# Patient Record
Sex: Female | Born: 2000
Health system: Southern US, Community
[De-identification: ages and names within clinical notes are randomized; demographics above are authoritative.]

## PROBLEM LIST (undated history)

## (undated) DIAGNOSIS — Z87820 Personal history of traumatic brain injury: Secondary | ICD-10-CM

## (undated) DIAGNOSIS — Z8781 Personal history of (healed) traumatic fracture: Secondary | ICD-10-CM

## (undated) DIAGNOSIS — S82409A Unspecified fracture of shaft of unspecified fibula, initial encounter for closed fracture: Secondary | ICD-10-CM

## (undated) DIAGNOSIS — Q782 Osteopetrosis: Secondary | ICD-10-CM

## (undated) DIAGNOSIS — S92502A Displaced unspecified fracture of left lesser toe(s), initial encounter for closed fracture: Secondary | ICD-10-CM

## (undated) DIAGNOSIS — S2231XA Fracture of one rib, right side, initial encounter for closed fracture: Secondary | ICD-10-CM

## (undated) DIAGNOSIS — S82209A Unspecified fracture of shaft of unspecified tibia, initial encounter for closed fracture: Secondary | ICD-10-CM

## (undated) HISTORY — DX: Displaced unspecified fracture of left lesser toe(s), initial encounter for closed fracture: S92.502A

## (undated) HISTORY — DX: Personal history of traumatic brain injury: Z87.820

## (undated) HISTORY — DX: Fracture of one rib, right side, initial encounter for closed fracture: S22.31XA

## (undated) HISTORY — DX: Personal history of (healed) traumatic fracture: Z87.81

## (undated) HISTORY — DX: Unspecified fracture of shaft of unspecified fibula, initial encounter for closed fracture: S82.409A

## (undated) HISTORY — DX: Osteopetrosis: Q78.2

## (undated) HISTORY — DX: Unspecified fracture of shaft of unspecified tibia, initial encounter for closed fracture: S82.209A

---
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
## 2020-01-06 DIAGNOSIS — J342 Deviated nasal septum: Secondary | ICD-10-CM | POA: Diagnosis not present

## 2020-01-06 DIAGNOSIS — J343 Hypertrophy of nasal turbinates: Secondary | ICD-10-CM | POA: Diagnosis not present

## 2020-01-06 DIAGNOSIS — J310 Chronic rhinitis: Secondary | ICD-10-CM | POA: Diagnosis not present

## 2020-01-06 DIAGNOSIS — R490 Dysphonia: Secondary | ICD-10-CM | POA: Diagnosis not present

## 2020-01-06 DIAGNOSIS — K219 Gastro-esophageal reflux disease without esophagitis: Secondary | ICD-10-CM | POA: Diagnosis not present

## 2020-01-12 ENCOUNTER — Other Ambulatory Visit: Payer: Self-pay | Admitting: Family Medicine

## 2020-01-12 DIAGNOSIS — R41840 Attention and concentration deficit: Secondary | ICD-10-CM

## 2020-01-12 MED ORDER — LISDEXAMFETAMINE DIMESYLATE 20 MG PO CAPS: 20.0000 mg | ORAL_CAPSULE | Freq: Every day | ORAL | 0 refills | Status: DC

## 2020-01-12 MED FILL — VYVANSE 20 MG CAPSULE: 20 | 30 days supply | Qty: 30 | Fill #0

## 2020-01-23 ENCOUNTER — Telehealth: Payer: Self-pay | Admitting: Family

## 2020-01-23 MED ORDER — SUMATRIPTAN SUCCINATE 50 MG PO TABS: ORAL_TABLET | ORAL | 0 refills | Status: DC

## 2020-01-23 MED ORDER — PROMETHAZINE HCL 25 MG PO TABS
25.0000 mg | ORAL_TABLET | Freq: Three times a day (TID) | ORAL | 0 refills | Status: DC | PRN
Start: 2020-01-23 — End: 2020-01-23

## 2020-01-23 MED ORDER — PROMETHAZINE HCL 25 MG PO TABS: 25.0000 mg | ORAL_TABLET | Freq: Three times a day (TID) | ORAL | 0 refills | Status: DC | PRN

## 2020-01-23 NOTE — Addendum Note (Signed)
Addended by: Debbrah Alar on: 01/23/2020 07:19 PM   Modules accepted: Orders

## 2020-01-23 NOTE — Telephone Encounter (Signed)
Spoke with mom, pt is at college and has a bad migraine- no improvement with excedrin migraine. Pt has had a good response in the past to promethazine.  Rx sent for promethazine as well as imitrex.

## 2020-01-31 DIAGNOSIS — R490 Dysphonia: Secondary | ICD-10-CM | POA: Diagnosis not present

## 2020-01-31 DIAGNOSIS — K219 Gastro-esophageal reflux disease without esophagitis: Secondary | ICD-10-CM | POA: Diagnosis not present

## 2020-02-21 DIAGNOSIS — R490 Dysphonia: Secondary | ICD-10-CM | POA: Diagnosis not present

## 2020-02-21 DIAGNOSIS — K219 Gastro-esophageal reflux disease without esophagitis: Secondary | ICD-10-CM | POA: Diagnosis not present

## 2020-03-06 DIAGNOSIS — Z20822 Contact with and (suspected) exposure to covid-19: Secondary | ICD-10-CM | POA: Diagnosis not present

## 2020-03-13 DIAGNOSIS — K219 Gastro-esophageal reflux disease without esophagitis: Secondary | ICD-10-CM | POA: Diagnosis not present

## 2020-03-13 DIAGNOSIS — R490 Dysphonia: Secondary | ICD-10-CM | POA: Diagnosis not present

## 2020-03-19 ENCOUNTER — Other Ambulatory Visit: Payer: Self-pay

## 2020-03-19 ENCOUNTER — Other Ambulatory Visit: Payer: Self-pay | Admitting: Family Medicine

## 2020-03-19 DIAGNOSIS — R41840 Attention and concentration deficit: Secondary | ICD-10-CM

## 2020-03-19 MED ORDER — LISDEXAMFETAMINE DIMESYLATE 20 MG PO CAPS: 20.0000 mg | ORAL_CAPSULE | Freq: Every day | ORAL | 0 refills | Status: DC

## 2020-03-19 MED FILL — VYVANSE 20 MG CAPSULE: 20 | 30 days supply | Qty: 30 | Fill #0

## 2020-03-19 NOTE — Telephone Encounter (Signed)
Patient mother sent message requesting refill on medication while patient is at home on break.

## 2020-03-20 DIAGNOSIS — K219 Gastro-esophageal reflux disease without esophagitis: Secondary | ICD-10-CM | POA: Diagnosis not present

## 2020-03-20 DIAGNOSIS — R490 Dysphonia: Secondary | ICD-10-CM | POA: Diagnosis not present

## 2020-04-19 DIAGNOSIS — R490 Dysphonia: Secondary | ICD-10-CM | POA: Diagnosis not present

## 2020-04-19 DIAGNOSIS — K219 Gastro-esophageal reflux disease without esophagitis: Secondary | ICD-10-CM | POA: Diagnosis not present

## 2020-04-24 DIAGNOSIS — Z20822 Contact with and (suspected) exposure to covid-19: Secondary | ICD-10-CM | POA: Diagnosis not present

## 2020-05-03 DIAGNOSIS — K219 Gastro-esophageal reflux disease without esophagitis: Secondary | ICD-10-CM | POA: Diagnosis not present

## 2020-05-03 DIAGNOSIS — R490 Dysphonia: Secondary | ICD-10-CM | POA: Diagnosis not present

## 2020-05-07 ENCOUNTER — Other Ambulatory Visit: Payer: Self-pay

## 2020-05-07 ENCOUNTER — Other Ambulatory Visit: Payer: Self-pay | Admitting: Internal Medicine

## 2020-05-07 DIAGNOSIS — R41840 Attention and concentration deficit: Secondary | ICD-10-CM

## 2020-05-07 MED ORDER — SUMATRIPTAN SUCCINATE 50 MG PO TABS: ORAL_TABLET | ORAL | 0 refills | Status: DC

## 2020-05-07 MED ORDER — LISDEXAMFETAMINE DIMESYLATE 20 MG PO CAPS: 20.0000 mg | ORAL_CAPSULE | Freq: Every day | ORAL | 0 refills | Status: DC

## 2020-05-07 MED FILL — VYVANSE 20 MG CAPSULE: 20 | 30 days supply | Qty: 30 | Fill #0

## 2020-05-07 MED FILL — SUMATRIPTAN SUCC 50 MG TAB: 50 | 30 days supply | Qty: 10 | Fill #0

## 2020-05-10 DIAGNOSIS — R490 Dysphonia: Secondary | ICD-10-CM | POA: Diagnosis not present

## 2020-05-10 DIAGNOSIS — K219 Gastro-esophageal reflux disease without esophagitis: Secondary | ICD-10-CM | POA: Diagnosis not present

## 2020-05-17 DIAGNOSIS — R490 Dysphonia: Secondary | ICD-10-CM | POA: Diagnosis not present

## 2020-05-17 DIAGNOSIS — K219 Gastro-esophageal reflux disease without esophagitis: Secondary | ICD-10-CM | POA: Diagnosis not present

## 2020-05-21 ENCOUNTER — Encounter: Payer: Self-pay | Admitting: Family Medicine

## 2020-05-21 DIAGNOSIS — M62838 Other muscle spasm: Secondary | ICD-10-CM

## 2020-05-24 DIAGNOSIS — K219 Gastro-esophageal reflux disease without esophagitis: Secondary | ICD-10-CM | POA: Diagnosis not present

## 2020-05-24 DIAGNOSIS — J310 Chronic rhinitis: Secondary | ICD-10-CM | POA: Diagnosis not present

## 2020-05-24 DIAGNOSIS — R490 Dysphonia: Secondary | ICD-10-CM | POA: Diagnosis not present

## 2020-05-31 DIAGNOSIS — K219 Gastro-esophageal reflux disease without esophagitis: Secondary | ICD-10-CM | POA: Diagnosis not present

## 2020-05-31 DIAGNOSIS — R490 Dysphonia: Secondary | ICD-10-CM | POA: Diagnosis not present

## 2020-06-07 DIAGNOSIS — R490 Dysphonia: Secondary | ICD-10-CM | POA: Diagnosis not present

## 2020-06-07 DIAGNOSIS — R293 Abnormal posture: Secondary | ICD-10-CM | POA: Diagnosis not present

## 2020-06-07 DIAGNOSIS — M542 Cervicalgia: Secondary | ICD-10-CM | POA: Diagnosis not present

## 2020-06-07 DIAGNOSIS — K219 Gastro-esophageal reflux disease without esophagitis: Secondary | ICD-10-CM | POA: Diagnosis not present

## 2020-06-12 DIAGNOSIS — K219 Gastro-esophageal reflux disease without esophagitis: Secondary | ICD-10-CM | POA: Diagnosis not present

## 2020-06-12 DIAGNOSIS — R490 Dysphonia: Secondary | ICD-10-CM | POA: Diagnosis not present

## 2020-06-18 DIAGNOSIS — M542 Cervicalgia: Secondary | ICD-10-CM | POA: Diagnosis not present

## 2020-06-21 DIAGNOSIS — K219 Gastro-esophageal reflux disease without esophagitis: Secondary | ICD-10-CM | POA: Diagnosis not present

## 2020-06-21 DIAGNOSIS — R490 Dysphonia: Secondary | ICD-10-CM | POA: Diagnosis not present

## 2020-06-26 DIAGNOSIS — M542 Cervicalgia: Secondary | ICD-10-CM | POA: Diagnosis not present

## 2020-06-28 ENCOUNTER — Other Ambulatory Visit: Payer: Self-pay

## 2020-06-28 DIAGNOSIS — R41840 Attention and concentration deficit: Secondary | ICD-10-CM

## 2020-06-29 ENCOUNTER — Other Ambulatory Visit: Payer: Self-pay | Admitting: Family Medicine

## 2020-06-29 MED ORDER — LISDEXAMFETAMINE DIMESYLATE 20 MG PO CAPS: 20.0000 mg | ORAL_CAPSULE | Freq: Every day | ORAL | 0 refills | Status: DC

## 2020-07-05 DIAGNOSIS — R490 Dysphonia: Secondary | ICD-10-CM | POA: Diagnosis not present

## 2020-07-05 DIAGNOSIS — K219 Gastro-esophageal reflux disease without esophagitis: Secondary | ICD-10-CM | POA: Diagnosis not present

## 2020-07-16 ENCOUNTER — Other Ambulatory Visit: Payer: Self-pay | Admitting: Family Medicine

## 2020-07-16 DIAGNOSIS — L700 Acne vulgaris: Secondary | ICD-10-CM

## 2020-07-16 DIAGNOSIS — J383 Other diseases of vocal cords: Secondary | ICD-10-CM

## 2020-07-17 DIAGNOSIS — M542 Cervicalgia: Secondary | ICD-10-CM | POA: Diagnosis not present

## 2020-07-24 DIAGNOSIS — M542 Cervicalgia: Secondary | ICD-10-CM | POA: Diagnosis not present

## 2020-08-02 DIAGNOSIS — R490 Dysphonia: Secondary | ICD-10-CM | POA: Diagnosis not present

## 2020-08-02 DIAGNOSIS — K219 Gastro-esophageal reflux disease without esophagitis: Secondary | ICD-10-CM | POA: Diagnosis not present

## 2020-08-03 ENCOUNTER — Other Ambulatory Visit (HOSPITAL_BASED_OUTPATIENT_CLINIC_OR_DEPARTMENT_OTHER): Payer: Self-pay

## 2020-08-03 ENCOUNTER — Other Ambulatory Visit: Payer: Self-pay

## 2020-08-03 DIAGNOSIS — R41840 Attention and concentration deficit: Secondary | ICD-10-CM

## 2020-08-03 MED ORDER — LISDEXAMFETAMINE DIMESYLATE 20 MG PO CAPS
ORAL_CAPSULE | Freq: Every day | ORAL | 0 refills | Status: DC
  Filled 2020-08-03: qty 30, 30d supply, fill #0

## 2020-08-21 NOTE — Progress Notes (Addendum)
[facility] [address] [place], [place] [address] [phone]  Date:  08/27/2020   Name:  Christina Goodwin   DOB:  2001-03-14   MRN:  [id number]  PCP:  Darreld Mclean, MD    Chief Complaint: Other (ADD follow up/)   History of Present Illness:  Christina Goodwin is a 20 y.o. very pleasant female patient who presents with the following:  Here today for a follow-up visit Last seen by myself about a year ago History of migraine headache, ADD and ostoepetrosis  She is using vyvanse for her ADD- she feels like this controls her sx, but it does make her feel a bit anxious.  She takes during the school week but not on the weekends We discussed possibly changing to a different medication for ADD, however she notes that she has already tried a few medications, and Vyvanse that seems the best so far Her migraines are better, but she does get more just run of the mill headaches recently.  Perhaps due to allergies She just started back on retin-a for acne  She is a rising junior at 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  She does try to exercise- she enjoys riding her mom's peloton when they are home  She is not yet SA No excessive alcohol, no smoking or drug use   08/03/2020  08/03/2020   2  Vyvanse 20 Mg Capsule  30.00  30  Je Cop  [id number]  Med (5269)  0/0   Other  [place]    06/29/2020  06/29/2020   1  Vyvanse 20 Mg Capsule  30.00  30  Je Cop  [id number]  Med (5269)  0/0   Comm Ins  [place]    05/07/2020  05/07/2020   1  Vyvanse 20 Mg Capsule  30.00  30  Donald Siva  [id number]  Med [phone])  0/0   Comm Ins  [place]    03/19/2020  03/19/2020   1  Vyvanse 20 Mg Capsule  30.00  30  Je Cop  [id number]  Med (5269)  0/0   Comm Ins  [place]    01/12/2020  01/12/2020   1  Vyvanse 20 Mg Capsule              covid booster done   Wt Readings from Last 3 Encounters:   08/27/20 154 lb (69.9 kg) (83 %, Z= 0.97)*  11/14/19 161 lb (73 kg) (89 %, Z= 1.22)*  11/03/19 155 lb (70.3 kg) (86 %, Z= 1.07)*   * Growth percentiles are based on CDC (Girls, 2-20 Years) data.     Patient Active Problem List   Diagnosis Date Noted  . Finger laceration 11/14/2019  . Migraine with aura and with status migrainosus, not intractable 09/08/2019  . Attention deficit 08/12/2017  . 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 Well child check 10/08/2011    Past Medical History:  Diagnosis Date  . Fracture of fourth toe, left, closed 12/17/2011  . Fracture tibia/fibula 2010   Left (s/p a fall--not sports related)  . H/O finger fracture   . History of concussion approx age 61   Fell off couch due to laughing too hard; had n/v and HA x 24h---no return of sx's since that time.  . Osteopetrosis   . Right rib fracture    2 ribs fractured  in 2 spots with small pneumothorax--f/u cxr good.    Past Surgical History:  Procedure Laterality Date  . FRACTURE SURGERY  2010   tib/fib repair and pin removal (left); 2 surgeries    Social History   Tobacco Use  . Smoking status: Never Smoker  . Smokeless tobacco: Never Used  Vaping Use  . Vaping Use: Never used  Substance Use Topics  . Alcohol use: No  . Drug use: No    Family History  Problem Relation Age of Onset  . Migraines Mother        with puberty  . Headache Father   . Migraines Sister   . Migraines Maternal Grandmother        worse perimenopausal  . Migraines Paternal Grandmother   . Migraines Sister        not as bad as the pt & sister [name]  . Other Paternal Grandfather        osteopetrosis  . Migraines Other     Allergies  Allergen Reactions  . Hydrocodone Hives    "Red Spots"    Medication list has been reviewed and updated.  Current Outpatient Medications on File Prior to Visit  Medication Sig Dispense Refill  . etonogestrel (NEXPLANON) 68 MG IMPL implant 1 each by Subdermal route once.    .  lisdexamfetamine (VYVANSE) 20 MG capsule TAKE 1 CAPSULE BY MOUTH ONCE DAILY 30 capsule 0  . promethazine (PHENERGAN) 25 MG tablet Take 1 tablet (25 mg total) by mouth every 8 (eight) hours as needed for nausea or vomiting. 20 tablet 0  . SUMAtriptan (IMITREX) 50 MG tablet TAKE 1 TABLET BY MOUTH AT START OF MIGRAINE MAY REPEAT IN 2 HOURS IF HEADACHE PERSISTS OR RECURS *MAX 2 TABS IN 24 HOURS* 10 tablet 0  . tretinoin (RETIN-A) 0.05 % cream Apply topically at bedtime. Use every 2-3 days or less if skin becomes irritated 45 g 3   No current facility-administered medications on file prior to visit.    Review of Systems:  As per HPI- otherwise negative.   Physical Examination: Vitals:   08/27/20 1109  BP: 98/64  Pulse: 77  Resp: 18  SpO2: 98%   Vitals:   08/27/20 1109  Weight: 154 lb (69.9 kg)  Height: 5\' 6"  (1.676 m)   Body mass index is 24.86 kg/m. Ideal Body Weight: Weight in (lb) to have BMI = 25: 154.6  GEN: no acute distress.  Normal weight, looks well HEENT: Atraumatic, Normocephalic. Bilateral TM wnl, oropharynx normal.  PEERL,EOMI.   Mild acne mostly along chin Ears and Nose: No external deformity. CV: RRR, No M/G/R. No JVD. No thrill. No extra heart sounds. PULM: CTA B, no wheezes, crackles, rhonchi. No retractions. No resp. distress. No accessory muscle use. ABD: S, NT, ND, +BS. No rebound. No HSM. EXTR: No c/c/e PSYCH: Normally interactive. Conversant.    Assessment and Plan: Physical exam  Attention deficit - Plan: lisdexamfetamine (VYVANSE) 20 MG capsule  Alkaline phosphatase elevation - Plan: Gamma GT, Hepatic function panel  Migraine with aura and with status migrainosus, not intractable  Generally healthy young woman here today for physical exam Encouraged healthy diet and exercise routine Mildly elevated alkaline phosphatase last year, will recheck this for her today Suggested that she try over-the-counter nonsedating antihistamine or nasal steroid  spray for possible allergies Migraines have been stable She will let us know if any concerns or other needs This visit occurred during the SARS-CoV-2 public health emergency.  Safety protocols  were in place, including screening questions prior to the visit, additional usage of staff PPE, and extensive cleaning of exam room while observing appropriate contact time as indicated for disinfecting solutions.    Signed Lamar Blinks, MD  Received her labs as below, message to pt  Results for orders placed or performed in visit on 08/27/20  Gamma GT  Result Value Ref Range   GGT 25 7 - 51 U/L  Hepatic function panel  Result Value Ref Range   Total Bilirubin 0.7 0.2 - 1.2 mg/dL   Bilirubin, Direct 0.1 0.0 - 0.3 mg/dL   Alkaline Phosphatase 91 47 - 119 U/L   AST 41 (H) 0 - 37 U/L   ALT 22 0 - 35 U/L   Total Protein 7.4 6.0 - 8.3 g/dL   Albumin 4.7 3.5 - 5.2 g/dL

## 2020-08-27 ENCOUNTER — Other Ambulatory Visit: Payer: Self-pay

## 2020-08-27 ENCOUNTER — Encounter: Payer: Self-pay | Admitting: Family Medicine

## 2020-08-27 ENCOUNTER — Other Ambulatory Visit (HOSPITAL_BASED_OUTPATIENT_CLINIC_OR_DEPARTMENT_OTHER): Payer: Self-pay

## 2020-08-27 ENCOUNTER — Ambulatory Visit (INDEPENDENT_AMBULATORY_CARE_PROVIDER_SITE_OTHER): Payer: 59 | Admitting: Family Medicine

## 2020-08-27 VITALS — BP 98/64 | HR 77 | Resp 18 | Ht 66.0 in | Wt 154.0 lb

## 2020-08-27 DIAGNOSIS — G43101 Migraine with aura, not intractable, with status migrainosus: Secondary | ICD-10-CM | POA: Diagnosis not present

## 2020-08-27 DIAGNOSIS — R748 Abnormal levels of other serum enzymes: Secondary | ICD-10-CM

## 2020-08-27 DIAGNOSIS — R41840 Attention and concentration deficit: Secondary | ICD-10-CM

## 2020-08-27 DIAGNOSIS — Z0000 Encounter for general adult medical examination without abnormal findings: Secondary | ICD-10-CM | POA: Diagnosis not present

## 2020-08-27 LAB — HEPATIC FUNCTION PANEL
ALT: 22 U/L (ref 0–35)
AST: 41 U/L — ABNORMAL HIGH (ref 0–37)
Albumin: 4.7 g/dL (ref 3.5–5.2)
Alkaline Phosphatase: 91 U/L (ref 47–119)
Bilirubin, Direct: 0.1 mg/dL (ref 0.0–0.3)
Total Bilirubin: 0.7 mg/dL (ref 0.2–1.2)
Total Protein: 7.4 g/dL (ref 6.0–8.3)

## 2020-08-27 LAB — GAMMA GT: GGT: 25 U/L (ref 7–51)

## 2020-08-27 MED ORDER — LISDEXAMFETAMINE DIMESYLATE 20 MG PO CAPS
ORAL_CAPSULE | Freq: Every day | ORAL | 0 refills | Status: DC
  Filled 2020-08-27: qty 30, fill #0
  Filled 2020-08-31: qty 30, 30d supply, fill #0

## 2020-08-27 NOTE — Patient Instructions (Signed)
Great to see you again as always!  Take care and let me know if you have any concerns I wonder if your more frequent mild headaches may be related to allergies- perhaps try claritin/ zyrtec OTC and/ or a nasal steroid like flonase  Let me know if you need anything or have any concerns!     Health Maintenance, Female Adopting a healthy lifestyle and getting preventive care are important in promoting health and wellness. Ask your health care provider about:  The right schedule for you to have regular tests and exams.  Things you can do on your own to prevent diseases and keep yourself healthy. What should I know about diet, weight, and exercise? Eat a healthy diet  Eat a diet that includes plenty of vegetables, fruits, low-fat dairy products, and lean protein.  Do not eat a lot of foods that are high in solid fats, added sugars, or sodium.   Maintain a healthy weight Body mass index (BMI) is used to identify weight problems. It estimates body fat based on height and weight. Your health care provider can help determine your BMI and help you achieve or maintain a healthy weight. Get regular exercise Get regular exercise. This is one of the most important things you can do for your health. Most adults should:  Exercise for at least 150 minutes each week. The exercise should increase your heart rate and make you sweat (moderate-intensity exercise).  Do strengthening exercises at least twice a week. This is in addition to the moderate-intensity exercise.  Spend less time sitting. Even light physical activity can be beneficial. Watch cholesterol and blood lipids Have your blood tested for lipids and cholesterol at 20 years of age, then have this test every 5 years. Have your cholesterol levels checked more often if:  Your lipid or cholesterol levels are high.  You are older than 20 years of age.  You are at high risk for heart disease. What should I know about cancer  screening? Depending on your health history and family history, you may need to have cancer screening at various ages. This may include screening for:  Breast cancer.  Cervical cancer.  Colorectal cancer.  Skin cancer.  Lung cancer. What should I know about heart disease, diabetes, and high blood pressure? Blood pressure and heart disease  High blood pressure causes heart disease and increases the risk of stroke. This is more likely to develop in people who have high blood pressure readings, are of African descent, or are overweight.  Have your blood pressure checked: ? Every 3-5 years if you are 83-36 years of age. ? Every year if you are 34 years old or older. Diabetes Have regular diabetes screenings. This checks your fasting blood sugar level. Have the screening done:  Once every three years after age 74 if you are at a normal weight and have a low risk for diabetes.  More often and at a younger age if you are overweight or have a high risk for diabetes. What should I know about preventing infection? Hepatitis B If you have a higher risk for hepatitis B, you should be screened for this virus. Talk with your health care provider to find out if you are at risk for hepatitis B infection. Hepatitis C Testing is recommended for:  Everyone born from [birth year] through 1965.  Anyone with known risk factors for hepatitis C. Sexually transmitted infections (STIs)  Get screened for STIs, including gonorrhea and chlamydia, if: ? You are sexually active  and are younger than 20 years of age. ? You are older than 20 years of age and your health care provider tells you that you are at risk for this type of infection. ? Your sexual activity has changed since you were last screened, and you are at increased risk for chlamydia or gonorrhea. Ask your health care provider if you are at risk.  Ask your health care provider about whether you are at high risk for HIV. Your health care provider may  recommend a prescription medicine to help prevent HIV infection. If you choose to take medicine to prevent HIV, you should first get tested for HIV. You should then be tested every 3 months for as long as you are taking the medicine. Pregnancy  If you are about to stop having your period (premenopausal) and you may become pregnant, seek counseling before you get pregnant.  Take 400 to 800 micrograms (mcg) of folic acid every day if you become pregnant.  Ask for birth control (contraception) if you want to prevent pregnancy. Osteoporosis and menopause Osteoporosis is a disease in which the bones lose minerals and strength with aging. This can result in bone fractures. If you are 82 years old or older, or if you are at risk for osteoporosis and fractures, ask your health care provider if you should:  Be screened for bone loss.  Take a calcium or vitamin D supplement to lower your risk of fractures.  Be given hormone replacement therapy (HRT) to treat symptoms of menopause. Follow these instructions at home: Lifestyle  Do not use any products that contain nicotine or tobacco, such as cigarettes, e-cigarettes, and chewing tobacco. If you need help quitting, ask your health care provider.  Do not use street drugs.  Do not share needles.  Ask your health care provider for help if you need support or information about quitting drugs. Alcohol use  Do not drink alcohol if: ? Your health care provider tells you not to drink. ? You are pregnant, may be pregnant, or are planning to become pregnant.  If you drink alcohol: ? Limit how much you use to 0-1 drink a day. ? Limit intake if you are breastfeeding.  Be aware of how much alcohol is in your drink. In the [place] one drink equals one 12 oz bottle of beer (355 mL), one 5 oz glass of wine (148 mL), or one 1 oz glass of hard liquor (44 mL). General instructions  Schedule regular health, dental, and eye exams.  Stay current with your  vaccines.  Tell your health care provider if: ? You often feel depressed. ? You have ever been abused or do not feel safe at home. Summary  Adopting a healthy lifestyle and getting preventive care are important in promoting health and wellness.  Follow your health care provider's instructions about healthy diet, exercising, and getting tested or screened for diseases.  Follow your health care provider's instructions on monitoring your cholesterol and blood pressure. This information is not intended to replace advice given to you by your health care provider. Make sure you discuss any questions you have with your health care provider. Document Revised: 03/24/2018 Document Reviewed: 03/24/2018 Elsevier Patient Education  2021 [employer].

## 2020-08-31 ENCOUNTER — Other Ambulatory Visit (HOSPITAL_BASED_OUTPATIENT_CLINIC_OR_DEPARTMENT_OTHER): Payer: Self-pay

## 2020-09-11 ENCOUNTER — Other Ambulatory Visit (HOSPITAL_BASED_OUTPATIENT_CLINIC_OR_DEPARTMENT_OTHER): Payer: Self-pay

## 2020-11-20 ENCOUNTER — Other Ambulatory Visit: Payer: Self-pay

## 2020-11-20 ENCOUNTER — Other Ambulatory Visit (HOSPITAL_BASED_OUTPATIENT_CLINIC_OR_DEPARTMENT_OTHER): Payer: Self-pay

## 2020-11-20 DIAGNOSIS — R41840 Attention and concentration deficit: Secondary | ICD-10-CM

## 2020-11-20 MED ORDER — LISDEXAMFETAMINE DIMESYLATE 20 MG PO CAPS
ORAL_CAPSULE | Freq: Every day | ORAL | 0 refills | Status: DC
  Filled 2020-11-20: qty 30, 30d supply, fill #0

## 2020-12-11 ENCOUNTER — Other Ambulatory Visit (HOSPITAL_BASED_OUTPATIENT_CLINIC_OR_DEPARTMENT_OTHER): Payer: Self-pay

## 2020-12-11 MED ORDER — CARESTART COVID-19 HOME TEST VI KIT
PACK | 0 refills | Status: DC
  Filled 2020-12-11: qty 2, 4d supply, fill #0

## 2021-01-07 ENCOUNTER — Encounter: Payer: Self-pay | Admitting: Family Medicine

## 2021-01-07 DIAGNOSIS — R499 Unspecified voice and resonance disorder: Secondary | ICD-10-CM

## 2021-01-08 NOTE — Addendum Note (Signed)
Addended by: Lamar Blinks C on: 01/08/2021 07:02 AM   Modules accepted: Orders

## 2021-01-13 DIAGNOSIS — R0981 Nasal congestion: Secondary | ICD-10-CM | POA: Diagnosis not present

## 2021-01-13 DIAGNOSIS — J069 Acute upper respiratory infection, unspecified: Secondary | ICD-10-CM | POA: Diagnosis not present

## 2021-01-13 DIAGNOSIS — R051 Acute cough: Secondary | ICD-10-CM | POA: Diagnosis not present

## 2021-01-13 DIAGNOSIS — R5383 Other fatigue: Secondary | ICD-10-CM | POA: Diagnosis not present

## 2021-01-13 DIAGNOSIS — Z20822 Contact with and (suspected) exposure to covid-19: Secondary | ICD-10-CM | POA: Diagnosis not present

## 2021-01-13 DIAGNOSIS — J029 Acute pharyngitis, unspecified: Secondary | ICD-10-CM | POA: Diagnosis not present

## 2021-01-13 DIAGNOSIS — R12 Heartburn: Secondary | ICD-10-CM | POA: Diagnosis not present

## 2021-01-16 DIAGNOSIS — L700 Acne vulgaris: Secondary | ICD-10-CM | POA: Diagnosis not present

## 2021-01-16 DIAGNOSIS — L738 Other specified follicular disorders: Secondary | ICD-10-CM | POA: Diagnosis not present

## 2021-01-31 DIAGNOSIS — K219 Gastro-esophageal reflux disease without esophagitis: Secondary | ICD-10-CM | POA: Diagnosis not present

## 2021-02-14 ENCOUNTER — Other Ambulatory Visit: Payer: Self-pay | Admitting: Family Medicine

## 2021-02-14 DIAGNOSIS — R41840 Attention and concentration deficit: Secondary | ICD-10-CM

## 2021-02-15 ENCOUNTER — Other Ambulatory Visit (HOSPITAL_BASED_OUTPATIENT_CLINIC_OR_DEPARTMENT_OTHER): Payer: Self-pay

## 2021-02-15 MED ORDER — LISDEXAMFETAMINE DIMESYLATE 20 MG PO CAPS
ORAL_CAPSULE | Freq: Every day | ORAL | 0 refills | Status: DC
  Filled 2021-02-15 – 2021-02-28 (×2): qty 30, 30d supply, fill #0

## 2021-02-27 ENCOUNTER — Other Ambulatory Visit (HOSPITAL_BASED_OUTPATIENT_CLINIC_OR_DEPARTMENT_OTHER): Payer: Self-pay

## 2021-02-28 ENCOUNTER — Other Ambulatory Visit (HOSPITAL_BASED_OUTPATIENT_CLINIC_OR_DEPARTMENT_OTHER): Payer: Self-pay

## 2021-03-19 ENCOUNTER — Other Ambulatory Visit: Payer: Self-pay | Admitting: Family Medicine

## 2021-03-19 MED ORDER — CIPROFLOXACIN HCL 500 MG PO TABS: ORAL_TABLET | ORAL | 0 refills | Status: DC

## 2021-03-19 MED ORDER — ONDANSETRON HCL 8 MG PO TABS: 8.0000 mg | ORAL_TABLET | Freq: Three times a day (TID) | ORAL | 0 refills | Status: DC | PRN

## 2021-04-02 ENCOUNTER — Ambulatory Visit: Payer: 59 | Admitting: Internal Medicine

## 2021-04-04 ENCOUNTER — Other Ambulatory Visit (HOSPITAL_BASED_OUTPATIENT_CLINIC_OR_DEPARTMENT_OTHER): Payer: Self-pay

## 2021-04-04 MED ORDER — CARESTART COVID-19 HOME TEST VI KIT
PACK | 0 refills | Status: DC
  Filled 2021-04-04: qty 4, 4d supply, fill #0

## 2021-04-16 ENCOUNTER — Ambulatory Visit: Payer: 59 | Admitting: Family

## 2021-04-16 ENCOUNTER — Other Ambulatory Visit (HOSPITAL_BASED_OUTPATIENT_CLINIC_OR_DEPARTMENT_OTHER): Payer: Self-pay

## 2021-04-16 VITALS — BP 110/57 | HR 77 | Temp 98.5°F | Resp 16 | Wt 167.0 lb

## 2021-04-16 DIAGNOSIS — K148 Other diseases of tongue: Secondary | ICD-10-CM | POA: Diagnosis not present

## 2021-04-16 DIAGNOSIS — L700 Acne vulgaris: Secondary | ICD-10-CM | POA: Diagnosis not present

## 2021-04-16 MED ORDER — VALACYCLOVIR HCL 1 G PO TABS
1000.0000 mg | ORAL_TABLET | Freq: Two times a day (BID) | ORAL | 0 refills | Status: AC
  Filled 2021-04-16: qty 14, 7d supply, fill #0

## 2021-04-16 MED ORDER — NYSTATIN 100000 UNIT/ML MT SUSP
10.0000 mL | Freq: Three times a day (TID) | OROMUCOSAL | 0 refills | Status: AC
  Filled 2021-04-16: qty 210, 7d supply, fill #0

## 2021-04-16 NOTE — Patient Instructions (Signed)
Please begin valtrex and magic mouth wash. You should be contacted about your referral to [facility] in [place]-  [phone].

## 2021-04-16 NOTE — Progress Notes (Signed)
Subjective:     Patient ID: Christina Goodwin, female    DOB: April 28, 2000, 21 y.o.   MRN: [id number]  Chief Complaint  Patient presents with   Oral Pain    Complains of sore spot on tongue for 2 months    Oral Pain   Patient is in today to discuss a tongue sore which has been present for 2 months. Reports that it started small and has "gotten worse."  Reports pain with eating/swallowing. Saw an ENT at [facility] "back in the beginning" and she was told "everything is OK."    Health Maintenance Due  Topic Date Due   HIV Screening  Never done   Hepatitis C Screening  Never done   COVID-19 Vaccine (3 - Booster for [place] series) 09/24/2019   INFLUENZA VACCINE  11/12/2020    Past Medical History:  Diagnosis Date   Fracture of fourth toe, left, closed 12/17/2011   Fracture tibia/fibula 2010   Left (s/p a fall--not sports related)   H/O finger fracture    History of concussion approx age 76   Fell off couch due to laughing too hard; had n/v and HA x 24h---no return of sx's since that time.   Osteopetrosis    Right rib fracture    2 ribs fractured in 2 spots with small pneumothorax--f/u cxr good.    Past Surgical History:  Procedure Laterality Date   FRACTURE SURGERY  2010   tib/fib repair and pin removal (left); 2 surgeries    Family History  Problem Relation Age of Onset   Migraines Mother        with puberty   Headache Father    Migraines Sister    Migraines Maternal Grandmother        worse perimenopausal   Migraines Paternal 48    Migraines Sister        not as bad as the pt & sister [name]   Other Paternal Grandfather        osteopetrosis   Migraines Other     Social History   Socioeconomic History   Marital status: Single    Spouse name: Not on file   Number of children: Not on file   Years of education: Not on file   Highest education level: Not on file  Occupational History   Not on file  Tobacco Use   Smoking status: Never    Smokeless tobacco: Never  Vaping Use   Vaping Use: Never used  Substance and Sexual Activity   Alcohol use: No   Drug use: No   Sexual activity: Not on file  Other Topics Concern   Not on file  Social History Narrative   She is about to be sophomore in college    Lives with parents   Right handed   Caffeine: none             Social Determinants of Health   Financial Resource Strain: Not on file  Food Insecurity: Not on file  Transportation Needs: Not on file  Physical Activity: Not on file  Stress: Not on file  Social Connections: Not on file  Intimate Partner Violence: Not on file    Outpatient Medications Prior to Visit  Medication Sig Dispense Refill   doxycycline (VIBRAMYCIN) 100 MG capsule Take 100 mg by mouth 2 (two) times daily.     etonogestrel (NEXPLANON) 68 MG IMPL implant 1 each by Subdermal route once.     lisdexamfetamine (VYVANSE) 20 MG capsule  TAKE 1 CAPSULE BY MOUTH ONCE DAILY 30 capsule 0   promethazine (PHENERGAN) 25 MG tablet Take 1 tablet (25 mg total) by mouth every 8 (eight) hours as needed for nausea or vomiting. 20 tablet 0   SUMAtriptan (IMITREX) 50 MG tablet TAKE 1 TABLET BY MOUTH AT START OF MIGRAINE MAY REPEAT IN 2 HOURS IF HEADACHE PERSISTS OR RECURS *MAX 2 TABS IN 24 HOURS* 10 tablet 0   ciprofloxacin (CIPRO) 500 MG tablet Take 500 mg twice a day for 3 days as needed for diarrhea 18 tablet 0   COVID-19 At Home Antigen Test (CARESTART COVID-19 HOME TEST) KIT Use as directed 2 kit 0   COVID-19 At Home Antigen Test (CARESTART COVID-19 HOME TEST) KIT Use as directed per package instructions 4 each 0   ondansetron (ZOFRAN) 8 MG tablet Take 1 tablet (8 mg total) by mouth every 8 (eight) hours as needed for nausea or vomiting. 30 tablet 0   tretinoin (RETIN-A) 0.05 % cream Apply topically at bedtime. Use every 2-3 days or less if skin becomes irritated 45 g 3   No facility-administered medications prior to visit.    Allergies  Allergen Reactions    Hydrocodone Hives    "Red Spots"    ROS See HPI    Objective:    Physical Exam Constitutional:      Appearance: Normal appearance.  HENT:     Head: Normocephalic and atraumatic.     Mouth/Throat:     Comments: Raised yellow/white lesion left posterior underside of tongue  Neck:     Comments: Palpable cervical lymph nodes but none seem abnormally enlarged.  Musculoskeletal:     Cervical back: No tenderness.  Neurological:     Mental Status: She is alert.      BP (!) 110/57 (BP Location: Right Arm, Patient Position: Sitting, Cuff Size: Small)    Pulse 77    Temp 98.5 F (36.9 C) (Oral)    Resp 16    Wt 167 lb (75.8 kg)    SpO2 100%    BMI 26.95 kg/m  Wt Readings from Last 3 Encounters:  04/16/21 167 lb (75.8 kg)  08/27/20 154 lb (69.9 kg) (83 %, Z= 0.97)*  11/14/19 161 lb (73 kg) (89 %, Z= 1.22)*   * Growth percentiles are based on CDC (Girls, 2-20 Years) data.       Assessment & Plan:   Problem List Items Addressed This Visit       Unprioritized   Tongue lesion - Primary    Will give a trial of empiric valtrex, magic mouthwash and plan referral to a different ENT in [place].        Relevant Orders   Ambulatory referral to ENT    I have discontinued Justise [facility]. [facility]'s tretinoin, Carestart COVID-19 Home Test, ondansetron, ciprofloxacin, and Carestart COVID-19 Home Test. I am also having her start on valACYclovir and (magic mouthwash (nystatin, lidocaine, diphenhydrAMINE, alum & mag hydroxide) suspension). Additionally, I am having her maintain her Nexplanon, promethazine, SUMAtriptan, lisdexamfetamine, and doxycycline.  Meds ordered this encounter  Medications   valACYclovir (VALTREX) 1000 MG tablet    Sig: Take 1 tablet (1,000 mg total) by mouth 2 (two) times daily for 7 days.    Dispense:  14 tablet    Refill:  0    Order Specific Question:   Supervising Provider    Answer:   Penni Homans A [[id number]]   magic mouthwash (nystatin, lidocaine,  diphenhydrAMINE, alum & mag hydroxide)  suspension    Sig: Swish and swallow 10 mLs 3 (three) times daily for 7 days.    Dispense:  210 mL    Refill:  0    1:1:1:1 ratio for volume of ingredients    Order Specific Question:   Supervising Provider    Answer:   Penni Homans A [[id number]]

## 2021-04-16 NOTE — Assessment & Plan Note (Signed)
Will give a trial of empiric valtrex, magic mouthwash and plan referral to a different ENT in [place].

## 2021-04-17 ENCOUNTER — Other Ambulatory Visit (HOSPITAL_BASED_OUTPATIENT_CLINIC_OR_DEPARTMENT_OTHER): Payer: Self-pay

## 2021-04-18 ENCOUNTER — Other Ambulatory Visit (HOSPITAL_BASED_OUTPATIENT_CLINIC_OR_DEPARTMENT_OTHER): Payer: Self-pay

## 2021-04-18 MED ORDER — CARESTART COVID-19 HOME TEST VI KIT
PACK | 0 refills | Status: DC
  Filled 2021-04-18: qty 4, 4d supply, fill #0

## 2021-05-01 ENCOUNTER — Other Ambulatory Visit: Payer: Self-pay | Admitting: Family Medicine

## 2021-05-01 DIAGNOSIS — K148 Other diseases of tongue: Secondary | ICD-10-CM

## 2021-05-06 ENCOUNTER — Other Ambulatory Visit: Payer: Self-pay | Admitting: Family

## 2021-05-06 ENCOUNTER — Other Ambulatory Visit: Payer: Self-pay | Admitting: Internal Medicine

## 2021-05-06 ENCOUNTER — Other Ambulatory Visit: Payer: Self-pay | Admitting: Family Medicine

## 2021-05-06 ENCOUNTER — Other Ambulatory Visit (HOSPITAL_BASED_OUTPATIENT_CLINIC_OR_DEPARTMENT_OTHER): Payer: Self-pay

## 2021-05-06 ENCOUNTER — Encounter: Payer: Self-pay | Admitting: Family Medicine

## 2021-05-06 DIAGNOSIS — R41840 Attention and concentration deficit: Secondary | ICD-10-CM

## 2021-05-06 MED ORDER — PROMETHAZINE HCL 25 MG PO TABS
25.0000 mg | ORAL_TABLET | Freq: Three times a day (TID) | ORAL | 1 refills | Status: DC | PRN
Start: 2021-05-06 — End: 2021-07-29
  Filled 2021-05-06: qty 20, 7d supply, fill #0

## 2021-05-06 MED ORDER — SUMATRIPTAN SUCCINATE 50 MG PO TABS
ORAL_TABLET | ORAL | 3 refills | Status: DC
  Filled 2021-05-06: qty 10, 17d supply, fill #0

## 2021-05-06 MED ORDER — LISDEXAMFETAMINE DIMESYLATE 20 MG PO CAPS
ORAL_CAPSULE | Freq: Every day | ORAL | 0 refills | Status: DC
  Filled 2021-05-06: qty 30, 30d supply, fill #0

## 2021-05-06 NOTE — Telephone Encounter (Signed)
Patient is requesting a refill of the following medications: Requested Prescriptions   Pending Prescriptions Disp Refills   lisdexamfetamine (VYVANSE) 20 MG capsule 30 capsule 0    Sig: TAKE 1 CAPSULE BY MOUTH ONCE DAILY    Date of patient request: 05/06/21 Last office visit: 04/16/21 Date of last refill: 02/15/21 Last refill amount: 30 + 0 Follow up time period per chart: none yet

## 2021-05-14 ENCOUNTER — Other Ambulatory Visit (HOSPITAL_COMMUNITY): Payer: Self-pay

## 2021-05-14 DIAGNOSIS — C021 Malignant neoplasm of border of tongue: Secondary | ICD-10-CM | POA: Diagnosis not present

## 2021-05-14 MED ORDER — LIDOCAINE VISCOUS HCL 2 % MT SOLN
OROMUCOSAL | 2 refills | Status: DC
  Filled 2021-05-14: qty 200, 13d supply, fill #0

## 2021-05-14 MED ORDER — IBUPROFEN 400 MG PO TABS
ORAL_TABLET | ORAL | 0 refills | Status: DC
  Filled 2021-05-14: qty 30, 5d supply, fill #0

## 2021-05-14 MED ORDER — CHLORHEXIDINE GLUCONATE 0.12 % MT SOLN
OROMUCOSAL | 0 refills | Status: DC
  Filled 2021-05-14: qty 473, 16d supply, fill #0

## 2021-05-14 MED ORDER — HYDROCODONE-ACETAMINOPHEN 5-325 MG PO TABS
ORAL_TABLET | ORAL | 0 refills | Status: DC
Start: 2021-05-14 — End: 2023-12-09
  Filled 2021-05-14: qty 5, 2d supply, fill #0

## 2021-05-16 ENCOUNTER — Encounter: Payer: Self-pay | Admitting: Family Medicine

## 2021-05-20 DIAGNOSIS — C021 Malignant neoplasm of border of tongue: Secondary | ICD-10-CM | POA: Diagnosis not present

## 2021-05-24 ENCOUNTER — Other Ambulatory Visit: Payer: Self-pay | Admitting: Family Medicine

## 2021-05-24 DIAGNOSIS — D0007 Carcinoma in situ of tongue: Secondary | ICD-10-CM

## 2021-05-27 ENCOUNTER — Telehealth: Payer: Self-pay | Admitting: Family Medicine

## 2021-05-27 NOTE — Telephone Encounter (Signed)
I have been in contact with oral surgeon, Dr. Tamela Oddi and also the patient's mother, Dr. Willette Alma.   Unfortunately Christina Goodwin was diagnosed with squamous cell carcinoma of her tongue. She has an appointment tomorrow with ENT for further treatment plan  The patient is aware of this diagnosis and plan

## 2021-05-28 DIAGNOSIS — C029 Malignant neoplasm of tongue, unspecified: Secondary | ICD-10-CM | POA: Diagnosis not present

## 2021-05-28 DIAGNOSIS — K148 Other diseases of tongue: Secondary | ICD-10-CM | POA: Diagnosis not present

## 2021-05-29 DIAGNOSIS — C021 Malignant neoplasm of border of tongue: Secondary | ICD-10-CM | POA: Diagnosis not present

## 2021-05-29 DIAGNOSIS — R490 Dysphonia: Secondary | ICD-10-CM | POA: Diagnosis not present

## 2021-05-29 DIAGNOSIS — C029 Malignant neoplasm of tongue, unspecified: Secondary | ICD-10-CM | POA: Diagnosis not present

## 2021-05-29 DIAGNOSIS — R1312 Dysphagia, oropharyngeal phase: Secondary | ICD-10-CM | POA: Diagnosis not present

## 2021-06-03 DIAGNOSIS — K1230 Oral mucositis (ulcerative), unspecified: Secondary | ICD-10-CM | POA: Diagnosis not present

## 2021-06-03 DIAGNOSIS — C7989 Secondary malignant neoplasm of other specified sites: Secondary | ICD-10-CM | POA: Diagnosis not present

## 2021-06-03 DIAGNOSIS — C029 Malignant neoplasm of tongue, unspecified: Secondary | ICD-10-CM | POA: Diagnosis not present

## 2021-06-03 DIAGNOSIS — Z791 Long term (current) use of non-steroidal anti-inflammatories (NSAID): Secondary | ICD-10-CM | POA: Diagnosis not present

## 2021-06-03 DIAGNOSIS — C021 Malignant neoplasm of border of tongue: Secondary | ICD-10-CM | POA: Diagnosis not present

## 2021-06-03 DIAGNOSIS — C01 Malignant neoplasm of base of tongue: Secondary | ICD-10-CM | POA: Diagnosis not present

## 2021-06-03 DIAGNOSIS — K148 Other diseases of tongue: Secondary | ICD-10-CM | POA: Diagnosis not present

## 2021-06-03 DIAGNOSIS — Q782 Osteopetrosis: Secondary | ICD-10-CM | POA: Diagnosis not present

## 2021-06-03 DIAGNOSIS — R590 Localized enlarged lymph nodes: Secondary | ICD-10-CM | POA: Diagnosis not present

## 2021-06-06 DIAGNOSIS — C021 Malignant neoplasm of border of tongue: Secondary | ICD-10-CM | POA: Diagnosis not present

## 2021-06-06 DIAGNOSIS — C01 Malignant neoplasm of base of tongue: Secondary | ICD-10-CM | POA: Diagnosis not present

## 2021-06-06 DIAGNOSIS — C029 Malignant neoplasm of tongue, unspecified: Secondary | ICD-10-CM | POA: Diagnosis not present

## 2021-06-08 ENCOUNTER — Other Ambulatory Visit (HOSPITAL_COMMUNITY): Payer: Self-pay

## 2021-06-08 MED ORDER — POLYETHYLENE GLYCOL 3350 17 G PO PACK
PACK | ORAL | 0 refills | Status: DC
  Filled 2021-06-08: qty 14, 14d supply, fill #0

## 2021-06-08 MED ORDER — ACETAMINOPHEN 650 MG/20.3ML PO SOLN
ORAL | 0 refills | Status: DC
  Filled 2021-06-08: qty 1705.2, 14d supply, fill #0

## 2021-06-08 MED ORDER — CELECOXIB 200 MG PO CAPS
200.0000 mg | ORAL_CAPSULE | ORAL | 0 refills | Status: DC
  Filled 2021-06-08: qty 28, 14d supply, fill #0

## 2021-06-08 MED ORDER — CHLORHEXIDINE GLUCONATE 0.12 % MT SOLN
OROMUCOSAL | 0 refills | Status: DC
Start: 2021-06-08 — End: 2021-06-21
  Filled 2021-06-08: qty 473, 10d supply, fill #0

## 2021-06-08 MED ORDER — NYSTATIN 100000 UNIT/ML MT SUSP
OROMUCOSAL | 0 refills | Status: DC
  Filled 2021-06-08: qty 840, 7d supply, fill #0
  Filled 2021-08-13: qty 240, 2d supply, fill #1

## 2021-06-08 MED ORDER — NYSTATIN 100000 UNIT/ML MT SUSP
OROMUCOSAL | 0 refills | Status: DC
  Filled 2021-06-08: qty 1680, 14d supply, fill #0

## 2021-06-08 MED ORDER — OXYCODONE HCL 5 MG/5ML PO SOLN
ORAL | 0 refills | Status: DC
  Filled 2021-06-08: qty 150, 5d supply, fill #0

## 2021-06-13 ENCOUNTER — Other Ambulatory Visit (HOSPITAL_COMMUNITY): Payer: Self-pay

## 2021-06-13 MED ORDER — OXYCODONE HCL 5 MG/5ML PO SOLN
ORAL | 0 refills | Status: DC
  Filled 2021-06-13: qty 100, 5d supply, fill #0

## 2021-06-17 DIAGNOSIS — Q782 Osteopetrosis: Secondary | ICD-10-CM | POA: Diagnosis not present

## 2021-06-17 DIAGNOSIS — C770 Secondary and unspecified malignant neoplasm of lymph nodes of head, face and neck: Secondary | ICD-10-CM | POA: Diagnosis not present

## 2021-06-17 DIAGNOSIS — C021 Malignant neoplasm of border of tongue: Secondary | ICD-10-CM | POA: Diagnosis not present

## 2021-06-17 DIAGNOSIS — G43909 Migraine, unspecified, not intractable, without status migrainosus: Secondary | ICD-10-CM | POA: Diagnosis not present

## 2021-06-17 DIAGNOSIS — C029 Malignant neoplasm of tongue, unspecified: Secondary | ICD-10-CM | POA: Diagnosis not present

## 2021-06-20 ENCOUNTER — Other Ambulatory Visit (HOSPITAL_COMMUNITY): Payer: Self-pay

## 2021-06-20 MED ORDER — OXYCODONE HCL 5 MG PO TABS
ORAL_TABLET | ORAL | 0 refills | Status: DC
  Filled 2021-06-20: qty 10, 2d supply, fill #0

## 2021-06-20 MED ORDER — BACITRACIN 500 UNIT/GM EX OINT: TOPICAL_OINTMENT | CUTANEOUS | 0 refills | Status: DC

## 2021-06-21 DIAGNOSIS — C029 Malignant neoplasm of tongue, unspecified: Secondary | ICD-10-CM | POA: Diagnosis not present

## 2021-06-24 DIAGNOSIS — C029 Malignant neoplasm of tongue, unspecified: Secondary | ICD-10-CM | POA: Diagnosis not present

## 2021-06-25 DIAGNOSIS — C021 Malignant neoplasm of border of tongue: Secondary | ICD-10-CM | POA: Diagnosis not present

## 2021-06-25 DIAGNOSIS — C029 Malignant neoplasm of tongue, unspecified: Secondary | ICD-10-CM | POA: Diagnosis not present

## 2021-06-25 DIAGNOSIS — Z9889 Other specified postprocedural states: Secondary | ICD-10-CM | POA: Diagnosis not present

## 2021-06-27 DIAGNOSIS — Z510 Encounter for antineoplastic radiation therapy: Secondary | ICD-10-CM | POA: Diagnosis not present

## 2021-06-27 DIAGNOSIS — C029 Malignant neoplasm of tongue, unspecified: Secondary | ICD-10-CM | POA: Diagnosis not present

## 2021-07-01 ENCOUNTER — Other Ambulatory Visit (HOSPITAL_COMMUNITY): Payer: Self-pay

## 2021-07-01 DIAGNOSIS — E288 Other ovarian dysfunction: Secondary | ICD-10-CM | POA: Diagnosis not present

## 2021-07-01 DIAGNOSIS — Z319 Encounter for procreative management, unspecified: Secondary | ICD-10-CM | POA: Diagnosis not present

## 2021-07-01 DIAGNOSIS — Z3162 Encounter for fertility preservation counseling: Secondary | ICD-10-CM | POA: Diagnosis not present

## 2021-07-01 MED ORDER — SODIUM FLUORIDE 1.1 % DT GEL
DENTAL | 5 refills | Status: DC
  Filled 2021-07-01 – 2021-07-16 (×5): qty 56, 30d supply, fill #0

## 2021-07-02 DIAGNOSIS — R1312 Dysphagia, oropharyngeal phase: Secondary | ICD-10-CM | POA: Diagnosis not present

## 2021-07-03 ENCOUNTER — Other Ambulatory Visit (HOSPITAL_COMMUNITY): Payer: Self-pay

## 2021-07-03 DIAGNOSIS — D630 Anemia in neoplastic disease: Secondary | ICD-10-CM | POA: Diagnosis not present

## 2021-07-03 DIAGNOSIS — Z9889 Other specified postprocedural states: Secondary | ICD-10-CM | POA: Diagnosis not present

## 2021-07-03 DIAGNOSIS — F4323 Adjustment disorder with mixed anxiety and depressed mood: Secondary | ICD-10-CM | POA: Diagnosis not present

## 2021-07-03 DIAGNOSIS — C770 Secondary and unspecified malignant neoplasm of lymph nodes of head, face and neck: Secondary | ICD-10-CM | POA: Diagnosis not present

## 2021-07-03 DIAGNOSIS — D649 Anemia, unspecified: Secondary | ICD-10-CM | POA: Diagnosis not present

## 2021-07-03 DIAGNOSIS — F909 Attention-deficit hyperactivity disorder, unspecified type: Secondary | ICD-10-CM | POA: Diagnosis not present

## 2021-07-03 DIAGNOSIS — C029 Malignant neoplasm of tongue, unspecified: Secondary | ICD-10-CM | POA: Diagnosis not present

## 2021-07-03 MED ORDER — ONDANSETRON HCL 8 MG PO TABS
ORAL_TABLET | ORAL | 0 refills | Status: DC
  Filled 2021-07-03: qty 50, 17d supply, fill #0

## 2021-07-04 DIAGNOSIS — Z3162 Encounter for fertility preservation counseling: Secondary | ICD-10-CM | POA: Diagnosis not present

## 2021-07-05 ENCOUNTER — Other Ambulatory Visit: Payer: Self-pay | Admitting: Family Medicine

## 2021-07-05 ENCOUNTER — Other Ambulatory Visit (HOSPITAL_BASED_OUTPATIENT_CLINIC_OR_DEPARTMENT_OTHER): Payer: Self-pay

## 2021-07-05 DIAGNOSIS — C029 Malignant neoplasm of tongue, unspecified: Secondary | ICD-10-CM | POA: Diagnosis not present

## 2021-07-05 DIAGNOSIS — E288 Other ovarian dysfunction: Secondary | ICD-10-CM | POA: Diagnosis not present

## 2021-07-05 DIAGNOSIS — Z510 Encounter for antineoplastic radiation therapy: Secondary | ICD-10-CM | POA: Diagnosis not present

## 2021-07-05 MED ORDER — ALPRAZOLAM 0.5 MG PO TABS
ORAL_TABLET | ORAL | 0 refills | Status: DC
  Filled 2021-07-05: qty 30, 10d supply, fill #0

## 2021-07-06 DIAGNOSIS — N83202 Unspecified ovarian cyst, left side: Secondary | ICD-10-CM | POA: Diagnosis not present

## 2021-07-06 DIAGNOSIS — N83292 Other ovarian cyst, left side: Secondary | ICD-10-CM | POA: Diagnosis not present

## 2021-07-08 ENCOUNTER — Other Ambulatory Visit: Payer: Self-pay | Admitting: Family Medicine

## 2021-07-08 MED ORDER — AZITHROMYCIN 250 MG PO TABS: ORAL_TABLET | ORAL | 0 refills | Status: AC

## 2021-07-09 DIAGNOSIS — Z3162 Encounter for fertility preservation counseling: Secondary | ICD-10-CM | POA: Diagnosis not present

## 2021-07-09 DIAGNOSIS — Z3183 Encounter for assisted reproductive fertility procedure cycle: Secondary | ICD-10-CM | POA: Diagnosis not present

## 2021-07-09 DIAGNOSIS — C029 Malignant neoplasm of tongue, unspecified: Secondary | ICD-10-CM | POA: Diagnosis not present

## 2021-07-10 ENCOUNTER — Other Ambulatory Visit (HOSPITAL_COMMUNITY): Payer: Self-pay

## 2021-07-11 DIAGNOSIS — Z3183 Encounter for assisted reproductive fertility procedure cycle: Secondary | ICD-10-CM | POA: Diagnosis not present

## 2021-07-11 DIAGNOSIS — Z3162 Encounter for fertility preservation counseling: Secondary | ICD-10-CM | POA: Diagnosis not present

## 2021-07-13 DIAGNOSIS — Z3183 Encounter for assisted reproductive fertility procedure cycle: Secondary | ICD-10-CM | POA: Diagnosis not present

## 2021-07-15 DIAGNOSIS — C049 Malignant neoplasm of floor of mouth, unspecified: Secondary | ICD-10-CM | POA: Diagnosis not present

## 2021-07-15 DIAGNOSIS — Z3183 Encounter for assisted reproductive fertility procedure cycle: Secondary | ICD-10-CM | POA: Diagnosis not present

## 2021-07-15 DIAGNOSIS — Z510 Encounter for antineoplastic radiation therapy: Secondary | ICD-10-CM | POA: Diagnosis not present

## 2021-07-15 DIAGNOSIS — R1312 Dysphagia, oropharyngeal phase: Secondary | ICD-10-CM | POA: Diagnosis not present

## 2021-07-15 DIAGNOSIS — C029 Malignant neoplasm of tongue, unspecified: Secondary | ICD-10-CM | POA: Diagnosis not present

## 2021-07-16 ENCOUNTER — Other Ambulatory Visit (HOSPITAL_COMMUNITY): Payer: Self-pay

## 2021-07-16 ENCOUNTER — Other Ambulatory Visit (HOSPITAL_BASED_OUTPATIENT_CLINIC_OR_DEPARTMENT_OTHER): Payer: Self-pay

## 2021-07-16 DIAGNOSIS — C049 Malignant neoplasm of floor of mouth, unspecified: Secondary | ICD-10-CM | POA: Diagnosis not present

## 2021-07-16 DIAGNOSIS — C029 Malignant neoplasm of tongue, unspecified: Secondary | ICD-10-CM | POA: Diagnosis not present

## 2021-07-16 DIAGNOSIS — Z510 Encounter for antineoplastic radiation therapy: Secondary | ICD-10-CM | POA: Diagnosis not present

## 2021-07-17 ENCOUNTER — Other Ambulatory Visit: Payer: Self-pay

## 2021-07-17 ENCOUNTER — Other Ambulatory Visit: Payer: Self-pay | Admitting: Family Medicine

## 2021-07-17 ENCOUNTER — Other Ambulatory Visit (HOSPITAL_COMMUNITY): Payer: Self-pay

## 2021-07-17 ENCOUNTER — Telehealth: Payer: Self-pay | Admitting: Family Medicine

## 2021-07-17 DIAGNOSIS — R41840 Attention and concentration deficit: Secondary | ICD-10-CM

## 2021-07-17 DIAGNOSIS — Z5111 Encounter for antineoplastic chemotherapy: Secondary | ICD-10-CM | POA: Diagnosis not present

## 2021-07-17 DIAGNOSIS — Z79630 Long term (current) use of alkylating agent: Secondary | ICD-10-CM | POA: Diagnosis not present

## 2021-07-17 DIAGNOSIS — Z510 Encounter for antineoplastic radiation therapy: Secondary | ICD-10-CM | POA: Diagnosis not present

## 2021-07-17 DIAGNOSIS — C021 Malignant neoplasm of border of tongue: Secondary | ICD-10-CM | POA: Diagnosis not present

## 2021-07-17 DIAGNOSIS — C049 Malignant neoplasm of floor of mouth, unspecified: Secondary | ICD-10-CM | POA: Diagnosis not present

## 2021-07-17 DIAGNOSIS — C029 Malignant neoplasm of tongue, unspecified: Secondary | ICD-10-CM | POA: Diagnosis not present

## 2021-07-17 NOTE — Telephone Encounter (Signed)
FMLA faxed in for [facility] to front office ?Placed in [place] bin ? ? ?

## 2021-07-18 ENCOUNTER — Other Ambulatory Visit (HOSPITAL_BASED_OUTPATIENT_CLINIC_OR_DEPARTMENT_OTHER): Payer: Self-pay

## 2021-07-18 DIAGNOSIS — C029 Malignant neoplasm of tongue, unspecified: Secondary | ICD-10-CM | POA: Diagnosis not present

## 2021-07-18 DIAGNOSIS — C049 Malignant neoplasm of floor of mouth, unspecified: Secondary | ICD-10-CM | POA: Diagnosis not present

## 2021-07-18 DIAGNOSIS — Z510 Encounter for antineoplastic radiation therapy: Secondary | ICD-10-CM | POA: Diagnosis not present

## 2021-07-18 MED ORDER — LISDEXAMFETAMINE DIMESYLATE 20 MG PO CAPS
20.0000 mg | ORAL_CAPSULE | Freq: Every day | ORAL | 0 refills | Status: DC
  Filled 2021-07-18: qty 30, 30d supply, fill #0

## 2021-07-18 NOTE — Telephone Encounter (Signed)
In folder for completion.  

## 2021-07-18 NOTE — Telephone Encounter (Signed)
Completed and faxed today, copy to Willette Alma  ?

## 2021-07-19 DIAGNOSIS — C049 Malignant neoplasm of floor of mouth, unspecified: Secondary | ICD-10-CM | POA: Diagnosis not present

## 2021-07-19 DIAGNOSIS — Z510 Encounter for antineoplastic radiation therapy: Secondary | ICD-10-CM | POA: Diagnosis not present

## 2021-07-19 DIAGNOSIS — C029 Malignant neoplasm of tongue, unspecified: Secondary | ICD-10-CM | POA: Diagnosis not present

## 2021-07-22 ENCOUNTER — Other Ambulatory Visit (HOSPITAL_COMMUNITY): Payer: Self-pay

## 2021-07-22 DIAGNOSIS — C029 Malignant neoplasm of tongue, unspecified: Secondary | ICD-10-CM | POA: Diagnosis not present

## 2021-07-22 DIAGNOSIS — C049 Malignant neoplasm of floor of mouth, unspecified: Secondary | ICD-10-CM | POA: Diagnosis not present

## 2021-07-22 DIAGNOSIS — Z510 Encounter for antineoplastic radiation therapy: Secondary | ICD-10-CM | POA: Diagnosis not present

## 2021-07-22 DIAGNOSIS — R1312 Dysphagia, oropharyngeal phase: Secondary | ICD-10-CM | POA: Diagnosis not present

## 2021-07-22 MED ORDER — ONDANSETRON HCL 8 MG PO TABS
ORAL_TABLET | ORAL | 0 refills | Status: DC
  Filled 2021-07-22: qty 50, 17d supply, fill #0

## 2021-07-23 DIAGNOSIS — R1312 Dysphagia, oropharyngeal phase: Secondary | ICD-10-CM | POA: Diagnosis not present

## 2021-07-23 DIAGNOSIS — R490 Dysphonia: Secondary | ICD-10-CM | POA: Diagnosis not present

## 2021-07-23 DIAGNOSIS — Z510 Encounter for antineoplastic radiation therapy: Secondary | ICD-10-CM | POA: Diagnosis not present

## 2021-07-23 DIAGNOSIS — C049 Malignant neoplasm of floor of mouth, unspecified: Secondary | ICD-10-CM | POA: Diagnosis not present

## 2021-07-23 DIAGNOSIS — C029 Malignant neoplasm of tongue, unspecified: Secondary | ICD-10-CM | POA: Diagnosis not present

## 2021-07-24 DIAGNOSIS — Z5111 Encounter for antineoplastic chemotherapy: Secondary | ICD-10-CM | POA: Diagnosis not present

## 2021-07-24 DIAGNOSIS — L709 Acne, unspecified: Secondary | ICD-10-CM | POA: Diagnosis not present

## 2021-07-24 DIAGNOSIS — C021 Malignant neoplasm of border of tongue: Secondary | ICD-10-CM | POA: Diagnosis not present

## 2021-07-24 DIAGNOSIS — T451X5A Adverse effect of antineoplastic and immunosuppressive drugs, initial encounter: Secondary | ICD-10-CM | POA: Diagnosis not present

## 2021-07-24 DIAGNOSIS — C049 Malignant neoplasm of floor of mouth, unspecified: Secondary | ICD-10-CM | POA: Diagnosis not present

## 2021-07-24 DIAGNOSIS — G8918 Other acute postprocedural pain: Secondary | ICD-10-CM | POA: Diagnosis not present

## 2021-07-24 DIAGNOSIS — C029 Malignant neoplasm of tongue, unspecified: Secondary | ICD-10-CM | POA: Diagnosis not present

## 2021-07-24 DIAGNOSIS — Z510 Encounter for antineoplastic radiation therapy: Secondary | ICD-10-CM | POA: Diagnosis not present

## 2021-07-24 DIAGNOSIS — R112 Nausea with vomiting, unspecified: Secondary | ICD-10-CM | POA: Diagnosis not present

## 2021-07-24 DIAGNOSIS — M542 Cervicalgia: Secondary | ICD-10-CM | POA: Diagnosis not present

## 2021-07-24 NOTE — Progress Notes (Signed)
Therapist, music at [facility] ?[address], [address] ?[place],  [address] ?336 [phone] ?Fax [phone] ? ?Date:  07/29/2021  ? ?Name:  Christina Goodwin   DOB:  2001/01/29   MRN:  [id number] ? ?PCP:  Darreld Mclean, MD  ? ? ?Chief Complaint: No chief complaint on file. ? ? ?History of Present Illness: ? ?Christina Goodwin is a 21 y.o. very pleasant female patient who presents with the following: ? ?Pt seen today for follow-up- history of ADHD and migraine headache, recently dx with stage 4 tongue cancer ?Virtual visit today, patient location is her parents home, my location is office.  Patient identity confirmed with 2 factors, she gives consent for virtual visit today ?She is being treated at [facility] ? ?1) Oral cavity SqCC, stage IVA (pT3N2b cM0). ?- Biopsy of left lateral tongue 05/23/21 [id number] at [facility]: Moderately well-differentiated squamous cell carcinoma (SqCC).  ?- Surgical pathology 06/06/21 [id number]: 2.5cm squamous cell carcinoma, conventional (keratinizing) type, moderately differentiated, negative margins, 39m DOI, +PNI, no LVI, close 272mdeep margin, read as pT3NX. ?- Surgical pathology 06/17/21 [id number] in two nodes at left neck level 2A up to 0.9cm with +CK5/6, +p40; overall +2/39 nodes with negative nodes at levels right level 1a and left levels 1b, 2b, and 4. ? ?TREATMENTS: ?- 06/06/21: Left partial glossectomy with floor of mouth resection (R0). ?- 06/17/21: Left selective neck dissection of levels 1a/1b/2a/2b/3/4/5b (R0). ?- 07/15/21 - CURRENT: Adjuvant radiation 60 Gy to the post-op OC and dissected L neck; 56 Gy to the elective right neck.  ?- 07/17/21 - CURRENT: Adjuvant cisplatin weekly 4074m2 concurrent with radiation ? ?CANCER STATUS: No evidence of disease s/p R0 resection. ?CURRENT REGIMEN: chemoradiation with weekly cisplatin ? ?She is taking vyvanse when needed -she is still doing 1 class from college online.  She withdrew from the rest of her  classes for the semester, hopes to return for the fall semester ? ?She is doing 6 weeks of radiation ?Chemo for 5 weeks, maybe 6 if she can tolerate ?She notes nausea and vomiting especially after chemo treatments, Phenergan is helpful for this.  She could use a refill.  Her oncologist actually gave her some Zyprexa to use for nausea but she has not yet tried it ? ?She notes her pain is more significant in the mornings, when her throat will feel very dry. ?Christina Goodwin is a very talEcologistShe has tried at her singing voice a little bit since treatment began, she notes her voice is better than she had hoped though not normal ? ?She has a lot of support from family and friends.  We discussed depression and anxiety.  Christina Goodwin is seeing a couSocial workerrough oncology.  At this point she feels like she is handling things as well as possible, she does not wish to start a medication for depression ?Patient Active Problem List  ? Diagnosis Date Noted  ? Tongue lesion 04/16/2021  ? Finger laceration 11/14/2019  ? Migraine with aura and with status migrainosus, not intractable 09/08/2019  ? Attention deficit 08/12/2017  ? Osteopetrosis 12/31/2015  ? Well child check 10/08/2011  ? ? ?Past Medical History:  ?Diagnosis Date  ? Fracture of fourth toe, left, closed 12/17/2011  ? Fracture tibia/fibula 2010  ? Left (s/p a fall--not sports related)  ? H/O finger fracture   ? History of concussion approx age 37  62 Fell off couch due to laughing too hard; had n/v and HA  x 24h---no return of sx's since that time.  ? Osteopetrosis   ? Right rib fracture   ? 2 ribs fractured in 2 spots with small pneumothorax--f/u cxr good.  ? ? ?Past Surgical History:  ?Procedure Laterality Date  ? FRACTURE SURGERY  2010  ? tib/fib repair and pin removal (left); 2 surgeries  ? ? ?Social History  ? ?Tobacco Use  ? Smoking status: Never  ? Smokeless tobacco: Never  ?Vaping Use  ? Vaping Use: Never used  ?Substance Use Topics  ? Alcohol use: No  ? Drug use: No   ? ? ?Family History  ?Problem Relation Age of Onset  ? Migraines Mother   ?     with puberty  ? Headache Father   ? Migraines Sister   ? Migraines Maternal Grandmother   ?     worse perimenopausal  ? Migraines Paternal Grandmother   ? Migraines Sister   ?     not as bad as the pt & sister Christina Goodwin  ? Other Paternal Grandfather   ?     osteopetrosis  ? Migraines Other   ? ? ?Allergies  ?Allergen Reactions  ? Hydrocodone Hives  ?  "Red Spots" 05/14/21 per mom pt has nausea and not true allergy  ? ? ?Medication list has been reviewed and updated. ? ?Current Outpatient Medications on File Prior to Visit  ?Medication Sig Dispense Refill  ? Acetaminophen 650 MG/20.3ML SOLN Take 20.3 mLs (650 mg total) by mouth every 4 hours for 14 days. 1705.2 mL 0  ? ALPRAZolam (XANAX) 0.5 MG tablet Take 1/2 or 1 tablet by mouth up to three times a day as needed for anxiety. Start with 1/2 tablet(0.25 mg). 30 tablet 0  ? celecoxib (CELEBREX) 200 MG capsule Take 1 capsule (200 mg total) by mouth 2 times daily for 14 days. 28 capsule 0  ? COVID-19 At Home Antigen Test [facility] COVID-19 HOME TEST) KIT Use as directed per package instructions 4 each 0  ? doxycycline (VIBRAMYCIN) 100 MG capsule Take 100 mg by mouth 2 (two) times daily.    ? etonogestrel (NEXPLANON) 68 MG IMPL implant 1 each by Subdermal route once.    ? HYDROcodone-acetaminophen (NORCO/VICODIN) 5-325 MG tablet TAKE 1 TABLET BY MOUTH EVERY 4 TO 6 HOURS AS NEEDED FOR PAIN. 5 tablet 0  ? ibuprofen (ADVIL) 400 MG tablet TAKE 1 TABLET BY MOUTH EVERY 4 HOURS AS NEEDED FOR PAIN. NO MORE THAN 6 TABLETS PER DAY. 30 tablet 0  ? lidocaine (XYLOCAINE) 2 % solution Swish 5 mL in mouth and spit out two to three times daily. Do not swallow it, do not eat for 60 minutes after rinsing. 200 mL 2  ? lidocaine solution-diphenhydrAMINE liquid-nystatin suspension-alum & mag hydroxide-simeth suspension Take 30 mLs by mouth 4 times daily for 14 days 1680 mL 0  ? lisdexamfetamine (VYVANSE) 20 MG  capsule Take 1 capsule (20 mg total) by mouth daily. 30 capsule 0  ? ondansetron (ZOFRAN) 8 MG tablet Take 1 tablet (8 mg total) by mouth every 8 (eight) hours as needed for nausea or vomiting. 50 tablet 0  ? ondansetron (ZOFRAN) 8 MG tablet Take 1 tablet  by mouth every 8  hours as needed for nausea or vomiting 50 tablet 0  ? oxyCODONE (OXY IR/ROXICODONE) 5 MG immediate release tablet Take 1 tablet (5 mg total) by mouth every 4 (four) hours as needed for up to 5 days. 10 tablet 0  ? polyethylene glycol (MIRALAX /  GLYCOLAX) 17 g packet Mix 17 grams with 4-8 ounces of liquid and take by mouth daily for 14 days. 14 each 0  ? promethazine (PHENERGAN) 25 MG tablet Take 1 tablet (25 mg total) by mouth every 8 (eight) hours as needed for nausea or vomiting. 20 tablet 1  ? sodium fluoride (FLUORISHIELD) 1.1 % GEL dental gel Place 1 drop in each space of tray and insert after brushing/flossing. Wear for 10 mins at bedtime, remove and spit out excess. Do not eat, drink, or rinse for 30 mins. 56 g 5  ? SUMAtriptan (IMITREX) 50 MG tablet TAKE 1 TABLET BY MOUTH AT START OF MIGRAINE MAY REPEAT IN 2 HOURS IF HEADACHE PERSISTS OR RECURS *MAX 2 TABS IN 24 HOURS* 10 tablet 3  ? ?No current facility-administered medications on file prior to visit.  ? ? ?Review of Systems: ? ?As per HPI- otherwise negative. ? ? ?Physical Examination: ?There were no vitals filed for this visit. ?There were no vitals filed for this visit. ?There is no height or weight on file to calculate BMI. ?Ideal Body Weight:   ?Patient observed via MyChart video.  She looks well and her normal self over low resolution video.  No shortness of breath or distress is noted ? ?Assessment and Plan: ?Chemotherapy induced nausea and vomiting - Plan: promethazine (PHENERGAN) 25 MG tablet ? ?Attention deficit - Plan: lisdexamfetamine (VYVANSE) 20 MG capsule ? ?Following up today as described in HPI.  Offered our support and encouragement as she deals with this extremely  unfortunate and unexpected cancer diagnosis ? ?Refilled Vyvance and Phenergan to use as needed ? ?Signed ?Lamar Blinks, MD ? ?

## 2021-07-25 ENCOUNTER — Other Ambulatory Visit (HOSPITAL_BASED_OUTPATIENT_CLINIC_OR_DEPARTMENT_OTHER): Payer: Self-pay

## 2021-07-25 DIAGNOSIS — C029 Malignant neoplasm of tongue, unspecified: Secondary | ICD-10-CM | POA: Diagnosis not present

## 2021-07-25 DIAGNOSIS — Z510 Encounter for antineoplastic radiation therapy: Secondary | ICD-10-CM | POA: Diagnosis not present

## 2021-07-25 DIAGNOSIS — C049 Malignant neoplasm of floor of mouth, unspecified: Secondary | ICD-10-CM | POA: Diagnosis not present

## 2021-07-25 MED ORDER — CARESTART COVID-19 HOME TEST VI KIT
PACK | 0 refills | Status: DC
  Filled 2021-07-25: qty 2, 4d supply, fill #0

## 2021-07-26 DIAGNOSIS — C049 Malignant neoplasm of floor of mouth, unspecified: Secondary | ICD-10-CM | POA: Diagnosis not present

## 2021-07-26 DIAGNOSIS — C029 Malignant neoplasm of tongue, unspecified: Secondary | ICD-10-CM | POA: Diagnosis not present

## 2021-07-26 DIAGNOSIS — Z510 Encounter for antineoplastic radiation therapy: Secondary | ICD-10-CM | POA: Diagnosis not present

## 2021-07-29 ENCOUNTER — Other Ambulatory Visit (HOSPITAL_COMMUNITY): Payer: Self-pay

## 2021-07-29 ENCOUNTER — Telehealth (INDEPENDENT_AMBULATORY_CARE_PROVIDER_SITE_OTHER): Payer: 59 | Admitting: Family Medicine

## 2021-07-29 DIAGNOSIS — C029 Malignant neoplasm of tongue, unspecified: Secondary | ICD-10-CM | POA: Diagnosis not present

## 2021-07-29 DIAGNOSIS — T451X5A Adverse effect of antineoplastic and immunosuppressive drugs, initial encounter: Secondary | ICD-10-CM

## 2021-07-29 DIAGNOSIS — R41840 Attention and concentration deficit: Secondary | ICD-10-CM | POA: Diagnosis not present

## 2021-07-29 DIAGNOSIS — R490 Dysphonia: Secondary | ICD-10-CM | POA: Diagnosis not present

## 2021-07-29 DIAGNOSIS — R1312 Dysphagia, oropharyngeal phase: Secondary | ICD-10-CM | POA: Diagnosis not present

## 2021-07-29 DIAGNOSIS — C049 Malignant neoplasm of floor of mouth, unspecified: Secondary | ICD-10-CM | POA: Diagnosis not present

## 2021-07-29 DIAGNOSIS — Z510 Encounter for antineoplastic radiation therapy: Secondary | ICD-10-CM | POA: Diagnosis not present

## 2021-07-29 DIAGNOSIS — R112 Nausea with vomiting, unspecified: Secondary | ICD-10-CM

## 2021-07-29 MED ORDER — OXYCODONE HCL 5 MG PO TABS
ORAL_TABLET | ORAL | 0 refills | Status: DC
  Filled 2021-07-29: qty 120, 30d supply, fill #0

## 2021-07-29 MED ORDER — PROMETHAZINE HCL 25 MG PO TABS
25.0000 mg | ORAL_TABLET | Freq: Three times a day (TID) | ORAL | 1 refills | Status: DC | PRN
  Filled 2021-07-29: qty 30, 10d supply, fill #0

## 2021-07-29 MED ORDER — LISDEXAMFETAMINE DIMESYLATE 20 MG PO CAPS
20.0000 mg | ORAL_CAPSULE | Freq: Every day | ORAL | 0 refills | Status: DC
  Filled 2021-07-29: qty 30, 30d supply, fill #0

## 2021-07-29 MED ORDER — FLUCONAZOLE 100 MG PO TABS
ORAL_TABLET | ORAL | 0 refills | Status: DC
  Filled 2021-07-29: qty 11, 10d supply, fill #0

## 2021-07-30 ENCOUNTER — Other Ambulatory Visit (HOSPITAL_BASED_OUTPATIENT_CLINIC_OR_DEPARTMENT_OTHER): Payer: Self-pay

## 2021-07-30 ENCOUNTER — Other Ambulatory Visit: Payer: Self-pay | Admitting: Family Medicine

## 2021-07-30 DIAGNOSIS — C029 Malignant neoplasm of tongue, unspecified: Secondary | ICD-10-CM | POA: Diagnosis not present

## 2021-07-30 DIAGNOSIS — Z510 Encounter for antineoplastic radiation therapy: Secondary | ICD-10-CM | POA: Diagnosis not present

## 2021-07-30 DIAGNOSIS — C049 Malignant neoplasm of floor of mouth, unspecified: Secondary | ICD-10-CM | POA: Diagnosis not present

## 2021-07-30 MED ORDER — SCOPOLAMINE 1 MG/3DAYS TD PT72
1.0000 | MEDICATED_PATCH | TRANSDERMAL | 3 refills | Status: DC
  Filled 2021-07-30: qty 4, 12d supply, fill #0
  Filled 2021-07-30: qty 6, 18d supply, fill #0

## 2021-07-31 ENCOUNTER — Other Ambulatory Visit (HOSPITAL_COMMUNITY): Payer: Self-pay

## 2021-07-31 ENCOUNTER — Other Ambulatory Visit (HOSPITAL_BASED_OUTPATIENT_CLINIC_OR_DEPARTMENT_OTHER): Payer: Self-pay

## 2021-07-31 DIAGNOSIS — Z5181 Encounter for therapeutic drug level monitoring: Secondary | ICD-10-CM | POA: Diagnosis not present

## 2021-07-31 DIAGNOSIS — Z510 Encounter for antineoplastic radiation therapy: Secondary | ICD-10-CM | POA: Diagnosis not present

## 2021-07-31 DIAGNOSIS — M542 Cervicalgia: Secondary | ICD-10-CM | POA: Diagnosis not present

## 2021-07-31 DIAGNOSIS — K219 Gastro-esophageal reflux disease without esophagitis: Secondary | ICD-10-CM | POA: Diagnosis not present

## 2021-07-31 DIAGNOSIS — K1230 Oral mucositis (ulcerative), unspecified: Secondary | ICD-10-CM | POA: Diagnosis not present

## 2021-07-31 DIAGNOSIS — R110 Nausea: Secondary | ICD-10-CM | POA: Diagnosis not present

## 2021-07-31 DIAGNOSIS — G43009 Migraine without aura, not intractable, without status migrainosus: Secondary | ICD-10-CM | POA: Diagnosis not present

## 2021-07-31 DIAGNOSIS — L709 Acne, unspecified: Secondary | ICD-10-CM | POA: Diagnosis not present

## 2021-07-31 DIAGNOSIS — C049 Malignant neoplasm of floor of mouth, unspecified: Secondary | ICD-10-CM | POA: Diagnosis not present

## 2021-07-31 DIAGNOSIS — C029 Malignant neoplasm of tongue, unspecified: Secondary | ICD-10-CM | POA: Diagnosis not present

## 2021-07-31 DIAGNOSIS — C021 Malignant neoplasm of border of tongue: Secondary | ICD-10-CM | POA: Diagnosis not present

## 2021-07-31 DIAGNOSIS — K5900 Constipation, unspecified: Secondary | ICD-10-CM | POA: Diagnosis not present

## 2021-07-31 DIAGNOSIS — R682 Dry mouth, unspecified: Secondary | ICD-10-CM | POA: Diagnosis not present

## 2021-07-31 MED ORDER — PROCHLORPERAZINE MALEATE 10 MG PO TABS
ORAL_TABLET | ORAL | 0 refills | Status: DC
  Filled 2021-07-31: qty 30, 8d supply, fill #0

## 2021-08-01 DIAGNOSIS — C029 Malignant neoplasm of tongue, unspecified: Secondary | ICD-10-CM | POA: Diagnosis not present

## 2021-08-01 DIAGNOSIS — C049 Malignant neoplasm of floor of mouth, unspecified: Secondary | ICD-10-CM | POA: Diagnosis not present

## 2021-08-01 DIAGNOSIS — Z510 Encounter for antineoplastic radiation therapy: Secondary | ICD-10-CM | POA: Diagnosis not present

## 2021-08-02 DIAGNOSIS — C029 Malignant neoplasm of tongue, unspecified: Secondary | ICD-10-CM | POA: Diagnosis not present

## 2021-08-02 DIAGNOSIS — Z510 Encounter for antineoplastic radiation therapy: Secondary | ICD-10-CM | POA: Diagnosis not present

## 2021-08-02 DIAGNOSIS — C049 Malignant neoplasm of floor of mouth, unspecified: Secondary | ICD-10-CM | POA: Diagnosis not present

## 2021-08-04 DIAGNOSIS — Z5181 Encounter for therapeutic drug level monitoring: Secondary | ICD-10-CM | POA: Diagnosis not present

## 2021-08-05 DIAGNOSIS — R1312 Dysphagia, oropharyngeal phase: Secondary | ICD-10-CM | POA: Diagnosis not present

## 2021-08-05 DIAGNOSIS — Z510 Encounter for antineoplastic radiation therapy: Secondary | ICD-10-CM | POA: Diagnosis not present

## 2021-08-05 DIAGNOSIS — C049 Malignant neoplasm of floor of mouth, unspecified: Secondary | ICD-10-CM | POA: Diagnosis not present

## 2021-08-05 DIAGNOSIS — C029 Malignant neoplasm of tongue, unspecified: Secondary | ICD-10-CM | POA: Diagnosis not present

## 2021-08-05 DIAGNOSIS — F4323 Adjustment disorder with mixed anxiety and depressed mood: Secondary | ICD-10-CM | POA: Diagnosis not present

## 2021-08-06 DIAGNOSIS — Z510 Encounter for antineoplastic radiation therapy: Secondary | ICD-10-CM | POA: Diagnosis not present

## 2021-08-06 DIAGNOSIS — C029 Malignant neoplasm of tongue, unspecified: Secondary | ICD-10-CM | POA: Diagnosis not present

## 2021-08-06 DIAGNOSIS — C049 Malignant neoplasm of floor of mouth, unspecified: Secondary | ICD-10-CM | POA: Diagnosis not present

## 2021-08-07 DIAGNOSIS — C049 Malignant neoplasm of floor of mouth, unspecified: Secondary | ICD-10-CM | POA: Diagnosis not present

## 2021-08-07 DIAGNOSIS — K121 Other forms of stomatitis: Secondary | ICD-10-CM | POA: Diagnosis not present

## 2021-08-07 DIAGNOSIS — Z79630 Long term (current) use of alkylating agent: Secondary | ICD-10-CM | POA: Diagnosis not present

## 2021-08-07 DIAGNOSIS — C021 Malignant neoplasm of border of tongue: Secondary | ICD-10-CM | POA: Diagnosis not present

## 2021-08-07 DIAGNOSIS — Z79899 Other long term (current) drug therapy: Secondary | ICD-10-CM | POA: Diagnosis not present

## 2021-08-07 DIAGNOSIS — C029 Malignant neoplasm of tongue, unspecified: Secondary | ICD-10-CM | POA: Diagnosis not present

## 2021-08-07 DIAGNOSIS — R112 Nausea with vomiting, unspecified: Secondary | ICD-10-CM | POA: Diagnosis not present

## 2021-08-07 DIAGNOSIS — Z510 Encounter for antineoplastic radiation therapy: Secondary | ICD-10-CM | POA: Diagnosis not present

## 2021-08-07 DIAGNOSIS — H6121 Impacted cerumen, right ear: Secondary | ICD-10-CM | POA: Diagnosis not present

## 2021-08-07 DIAGNOSIS — R1311 Dysphagia, oral phase: Secondary | ICD-10-CM | POA: Diagnosis not present

## 2021-08-07 DIAGNOSIS — K1231 Oral mucositis (ulcerative) due to antineoplastic therapy: Secondary | ICD-10-CM | POA: Diagnosis not present

## 2021-08-07 DIAGNOSIS — T451X5A Adverse effect of antineoplastic and immunosuppressive drugs, initial encounter: Secondary | ICD-10-CM | POA: Diagnosis not present

## 2021-08-08 DIAGNOSIS — C029 Malignant neoplasm of tongue, unspecified: Secondary | ICD-10-CM | POA: Diagnosis not present

## 2021-08-08 DIAGNOSIS — Z510 Encounter for antineoplastic radiation therapy: Secondary | ICD-10-CM | POA: Diagnosis not present

## 2021-08-08 DIAGNOSIS — C049 Malignant neoplasm of floor of mouth, unspecified: Secondary | ICD-10-CM | POA: Diagnosis not present

## 2021-08-09 ENCOUNTER — Other Ambulatory Visit (HOSPITAL_BASED_OUTPATIENT_CLINIC_OR_DEPARTMENT_OTHER): Payer: Self-pay

## 2021-08-09 DIAGNOSIS — Z510 Encounter for antineoplastic radiation therapy: Secondary | ICD-10-CM | POA: Diagnosis not present

## 2021-08-09 DIAGNOSIS — C029 Malignant neoplasm of tongue, unspecified: Secondary | ICD-10-CM | POA: Diagnosis not present

## 2021-08-09 DIAGNOSIS — C049 Malignant neoplasm of floor of mouth, unspecified: Secondary | ICD-10-CM | POA: Diagnosis not present

## 2021-08-12 ENCOUNTER — Other Ambulatory Visit (HOSPITAL_BASED_OUTPATIENT_CLINIC_OR_DEPARTMENT_OTHER): Payer: Self-pay

## 2021-08-12 DIAGNOSIS — R1312 Dysphagia, oropharyngeal phase: Secondary | ICD-10-CM | POA: Diagnosis not present

## 2021-08-12 DIAGNOSIS — C049 Malignant neoplasm of floor of mouth, unspecified: Secondary | ICD-10-CM | POA: Diagnosis not present

## 2021-08-12 DIAGNOSIS — H6121 Impacted cerumen, right ear: Secondary | ICD-10-CM | POA: Diagnosis not present

## 2021-08-12 DIAGNOSIS — K121 Other forms of stomatitis: Secondary | ICD-10-CM | POA: Diagnosis not present

## 2021-08-12 DIAGNOSIS — C021 Malignant neoplasm of border of tongue: Secondary | ICD-10-CM | POA: Diagnosis not present

## 2021-08-12 DIAGNOSIS — Z5111 Encounter for antineoplastic chemotherapy: Secondary | ICD-10-CM | POA: Diagnosis not present

## 2021-08-12 DIAGNOSIS — T451X5D Adverse effect of antineoplastic and immunosuppressive drugs, subsequent encounter: Secondary | ICD-10-CM | POA: Diagnosis not present

## 2021-08-12 DIAGNOSIS — K1231 Oral mucositis (ulcerative) due to antineoplastic therapy: Secondary | ICD-10-CM | POA: Diagnosis not present

## 2021-08-12 DIAGNOSIS — Z79891 Long term (current) use of opiate analgesic: Secondary | ICD-10-CM | POA: Diagnosis not present

## 2021-08-12 DIAGNOSIS — R112 Nausea with vomiting, unspecified: Secondary | ICD-10-CM | POA: Diagnosis not present

## 2021-08-12 DIAGNOSIS — C069 Malignant neoplasm of mouth, unspecified: Secondary | ICD-10-CM | POA: Diagnosis not present

## 2021-08-12 DIAGNOSIS — R110 Nausea: Secondary | ICD-10-CM | POA: Diagnosis not present

## 2021-08-12 DIAGNOSIS — T451X5A Adverse effect of antineoplastic and immunosuppressive drugs, initial encounter: Secondary | ICD-10-CM | POA: Diagnosis not present

## 2021-08-12 DIAGNOSIS — K1230 Oral mucositis (ulcerative), unspecified: Secondary | ICD-10-CM | POA: Diagnosis not present

## 2021-08-12 DIAGNOSIS — C029 Malignant neoplasm of tongue, unspecified: Secondary | ICD-10-CM | POA: Diagnosis not present

## 2021-08-12 DIAGNOSIS — Z79630 Long term (current) use of alkylating agent: Secondary | ICD-10-CM | POA: Diagnosis not present

## 2021-08-12 DIAGNOSIS — Z510 Encounter for antineoplastic radiation therapy: Secondary | ICD-10-CM | POA: Diagnosis not present

## 2021-08-12 DIAGNOSIS — R1311 Dysphagia, oral phase: Secondary | ICD-10-CM | POA: Diagnosis not present

## 2021-08-13 ENCOUNTER — Other Ambulatory Visit (HOSPITAL_BASED_OUTPATIENT_CLINIC_OR_DEPARTMENT_OTHER): Payer: Self-pay

## 2021-08-13 ENCOUNTER — Other Ambulatory Visit (HOSPITAL_COMMUNITY): Payer: Self-pay

## 2021-08-13 DIAGNOSIS — Z510 Encounter for antineoplastic radiation therapy: Secondary | ICD-10-CM | POA: Diagnosis not present

## 2021-08-13 DIAGNOSIS — C029 Malignant neoplasm of tongue, unspecified: Secondary | ICD-10-CM | POA: Diagnosis not present

## 2021-08-13 DIAGNOSIS — C049 Malignant neoplasm of floor of mouth, unspecified: Secondary | ICD-10-CM | POA: Diagnosis not present

## 2021-08-14 DIAGNOSIS — Z79630 Long term (current) use of alkylating agent: Secondary | ICD-10-CM | POA: Diagnosis not present

## 2021-08-14 DIAGNOSIS — Z79891 Long term (current) use of opiate analgesic: Secondary | ICD-10-CM | POA: Diagnosis not present

## 2021-08-14 DIAGNOSIS — C069 Malignant neoplasm of mouth, unspecified: Secondary | ICD-10-CM | POA: Diagnosis not present

## 2021-08-14 DIAGNOSIS — K1230 Oral mucositis (ulcerative), unspecified: Secondary | ICD-10-CM | POA: Diagnosis not present

## 2021-08-14 DIAGNOSIS — Z510 Encounter for antineoplastic radiation therapy: Secondary | ICD-10-CM | POA: Diagnosis not present

## 2021-08-14 DIAGNOSIS — T451X5D Adverse effect of antineoplastic and immunosuppressive drugs, subsequent encounter: Secondary | ICD-10-CM | POA: Diagnosis not present

## 2021-08-14 DIAGNOSIS — K1231 Oral mucositis (ulcerative) due to antineoplastic therapy: Secondary | ICD-10-CM | POA: Diagnosis not present

## 2021-08-14 DIAGNOSIS — H6121 Impacted cerumen, right ear: Secondary | ICD-10-CM | POA: Diagnosis not present

## 2021-08-14 DIAGNOSIS — R112 Nausea with vomiting, unspecified: Secondary | ICD-10-CM | POA: Diagnosis not present

## 2021-08-14 DIAGNOSIS — K121 Other forms of stomatitis: Secondary | ICD-10-CM | POA: Diagnosis not present

## 2021-08-14 DIAGNOSIS — C029 Malignant neoplasm of tongue, unspecified: Secondary | ICD-10-CM | POA: Diagnosis not present

## 2021-08-14 DIAGNOSIS — R110 Nausea: Secondary | ICD-10-CM | POA: Diagnosis not present

## 2021-08-14 DIAGNOSIS — Z5111 Encounter for antineoplastic chemotherapy: Secondary | ICD-10-CM | POA: Diagnosis not present

## 2021-08-14 DIAGNOSIS — C021 Malignant neoplasm of border of tongue: Secondary | ICD-10-CM | POA: Diagnosis not present

## 2021-08-14 DIAGNOSIS — R1311 Dysphagia, oral phase: Secondary | ICD-10-CM | POA: Diagnosis not present

## 2021-08-14 DIAGNOSIS — C049 Malignant neoplasm of floor of mouth, unspecified: Secondary | ICD-10-CM | POA: Diagnosis not present

## 2021-08-14 DIAGNOSIS — T451X5A Adverse effect of antineoplastic and immunosuppressive drugs, initial encounter: Secondary | ICD-10-CM | POA: Diagnosis not present

## 2021-08-15 DIAGNOSIS — C029 Malignant neoplasm of tongue, unspecified: Secondary | ICD-10-CM | POA: Diagnosis not present

## 2021-08-15 DIAGNOSIS — C049 Malignant neoplasm of floor of mouth, unspecified: Secondary | ICD-10-CM | POA: Diagnosis not present

## 2021-08-15 DIAGNOSIS — Z510 Encounter for antineoplastic radiation therapy: Secondary | ICD-10-CM | POA: Diagnosis not present

## 2021-08-16 DIAGNOSIS — Z510 Encounter for antineoplastic radiation therapy: Secondary | ICD-10-CM | POA: Diagnosis not present

## 2021-08-16 DIAGNOSIS — C029 Malignant neoplasm of tongue, unspecified: Secondary | ICD-10-CM | POA: Diagnosis not present

## 2021-08-16 DIAGNOSIS — C049 Malignant neoplasm of floor of mouth, unspecified: Secondary | ICD-10-CM | POA: Diagnosis not present

## 2021-08-19 ENCOUNTER — Other Ambulatory Visit (HOSPITAL_COMMUNITY): Payer: Self-pay

## 2021-08-19 DIAGNOSIS — C049 Malignant neoplasm of floor of mouth, unspecified: Secondary | ICD-10-CM | POA: Diagnosis not present

## 2021-08-19 DIAGNOSIS — T451X5D Adverse effect of antineoplastic and immunosuppressive drugs, subsequent encounter: Secondary | ICD-10-CM | POA: Diagnosis not present

## 2021-08-19 DIAGNOSIS — R1312 Dysphagia, oropharyngeal phase: Secondary | ICD-10-CM | POA: Diagnosis not present

## 2021-08-19 DIAGNOSIS — Z79891 Long term (current) use of opiate analgesic: Secondary | ICD-10-CM | POA: Diagnosis not present

## 2021-08-19 DIAGNOSIS — Z79899 Other long term (current) drug therapy: Secondary | ICD-10-CM | POA: Diagnosis not present

## 2021-08-19 DIAGNOSIS — K5900 Constipation, unspecified: Secondary | ICD-10-CM | POA: Diagnosis not present

## 2021-08-19 DIAGNOSIS — Z510 Encounter for antineoplastic radiation therapy: Secondary | ICD-10-CM | POA: Diagnosis not present

## 2021-08-19 DIAGNOSIS — R110 Nausea: Secondary | ICD-10-CM | POA: Diagnosis not present

## 2021-08-19 DIAGNOSIS — F4323 Adjustment disorder with mixed anxiety and depressed mood: Secondary | ICD-10-CM | POA: Diagnosis not present

## 2021-08-19 DIAGNOSIS — C021 Malignant neoplasm of border of tongue: Secondary | ICD-10-CM | POA: Diagnosis not present

## 2021-08-19 DIAGNOSIS — C029 Malignant neoplasm of tongue, unspecified: Secondary | ICD-10-CM | POA: Diagnosis not present

## 2021-08-19 DIAGNOSIS — Z79630 Long term (current) use of alkylating agent: Secondary | ICD-10-CM | POA: Diagnosis not present

## 2021-08-19 DIAGNOSIS — K1231 Oral mucositis (ulcerative) due to antineoplastic therapy: Secondary | ICD-10-CM | POA: Diagnosis not present

## 2021-08-19 DIAGNOSIS — Z5181 Encounter for therapeutic drug level monitoring: Secondary | ICD-10-CM | POA: Diagnosis not present

## 2021-08-19 MED ORDER — PROCHLORPERAZINE MALEATE 10 MG PO TABS
ORAL_TABLET | ORAL | 2 refills | Status: DC
  Filled 2021-08-19: qty 30, 7d supply, fill #0

## 2021-08-20 ENCOUNTER — Other Ambulatory Visit (HOSPITAL_BASED_OUTPATIENT_CLINIC_OR_DEPARTMENT_OTHER): Payer: Self-pay

## 2021-08-20 ENCOUNTER — Other Ambulatory Visit (HOSPITAL_COMMUNITY): Payer: Self-pay

## 2021-08-20 ENCOUNTER — Other Ambulatory Visit: Payer: Self-pay | Admitting: Family Medicine

## 2021-08-20 DIAGNOSIS — Z510 Encounter for antineoplastic radiation therapy: Secondary | ICD-10-CM | POA: Diagnosis not present

## 2021-08-20 DIAGNOSIS — C049 Malignant neoplasm of floor of mouth, unspecified: Secondary | ICD-10-CM | POA: Diagnosis not present

## 2021-08-20 DIAGNOSIS — C029 Malignant neoplasm of tongue, unspecified: Secondary | ICD-10-CM | POA: Diagnosis not present

## 2021-08-20 MED ORDER — FLUCONAZOLE 100 MG PO TABS
ORAL_TABLET | ORAL | 0 refills | Status: DC
  Filled 2021-08-20: qty 11, 11d supply, fill #0

## 2021-08-20 MED ORDER — FLUCONAZOLE 100 MG PO TABS
100.0000 mg | ORAL_TABLET | Freq: Every day | ORAL | 0 refills | Status: DC
  Filled 2021-08-20: qty 15, 15d supply, fill #0

## 2021-08-21 DIAGNOSIS — Z510 Encounter for antineoplastic radiation therapy: Secondary | ICD-10-CM | POA: Diagnosis not present

## 2021-08-21 DIAGNOSIS — Z79630 Long term (current) use of alkylating agent: Secondary | ICD-10-CM | POA: Diagnosis not present

## 2021-08-21 DIAGNOSIS — Z5181 Encounter for therapeutic drug level monitoring: Secondary | ICD-10-CM | POA: Diagnosis not present

## 2021-08-21 DIAGNOSIS — C029 Malignant neoplasm of tongue, unspecified: Secondary | ICD-10-CM | POA: Diagnosis not present

## 2021-08-21 DIAGNOSIS — T451X5D Adverse effect of antineoplastic and immunosuppressive drugs, subsequent encounter: Secondary | ICD-10-CM | POA: Diagnosis not present

## 2021-08-21 DIAGNOSIS — Z79891 Long term (current) use of opiate analgesic: Secondary | ICD-10-CM | POA: Diagnosis not present

## 2021-08-21 DIAGNOSIS — R110 Nausea: Secondary | ICD-10-CM | POA: Diagnosis not present

## 2021-08-21 DIAGNOSIS — C021 Malignant neoplasm of border of tongue: Secondary | ICD-10-CM | POA: Diagnosis not present

## 2021-08-21 DIAGNOSIS — K1231 Oral mucositis (ulcerative) due to antineoplastic therapy: Secondary | ICD-10-CM | POA: Diagnosis not present

## 2021-08-21 DIAGNOSIS — Z79899 Other long term (current) drug therapy: Secondary | ICD-10-CM | POA: Diagnosis not present

## 2021-08-21 DIAGNOSIS — K5900 Constipation, unspecified: Secondary | ICD-10-CM | POA: Diagnosis not present

## 2021-08-21 DIAGNOSIS — C049 Malignant neoplasm of floor of mouth, unspecified: Secondary | ICD-10-CM | POA: Diagnosis not present

## 2021-08-22 ENCOUNTER — Other Ambulatory Visit (HOSPITAL_BASED_OUTPATIENT_CLINIC_OR_DEPARTMENT_OTHER): Payer: Self-pay

## 2021-08-22 DIAGNOSIS — C029 Malignant neoplasm of tongue, unspecified: Secondary | ICD-10-CM | POA: Diagnosis not present

## 2021-08-22 DIAGNOSIS — C049 Malignant neoplasm of floor of mouth, unspecified: Secondary | ICD-10-CM | POA: Diagnosis not present

## 2021-08-22 DIAGNOSIS — Z510 Encounter for antineoplastic radiation therapy: Secondary | ICD-10-CM | POA: Diagnosis not present

## 2021-08-23 DIAGNOSIS — C029 Malignant neoplasm of tongue, unspecified: Secondary | ICD-10-CM | POA: Diagnosis not present

## 2021-08-23 DIAGNOSIS — Z510 Encounter for antineoplastic radiation therapy: Secondary | ICD-10-CM | POA: Diagnosis not present

## 2021-08-23 DIAGNOSIS — C049 Malignant neoplasm of floor of mouth, unspecified: Secondary | ICD-10-CM | POA: Diagnosis not present

## 2021-08-28 ENCOUNTER — Other Ambulatory Visit (HOSPITAL_COMMUNITY): Payer: Self-pay

## 2021-08-28 DIAGNOSIS — E860 Dehydration: Secondary | ICD-10-CM | POA: Diagnosis not present

## 2021-08-28 DIAGNOSIS — R638 Other symptoms and signs concerning food and fluid intake: Secondary | ICD-10-CM | POA: Diagnosis not present

## 2021-08-28 DIAGNOSIS — K1233 Oral mucositis (ulcerative) due to radiation: Secondary | ICD-10-CM | POA: Diagnosis not present

## 2021-08-28 DIAGNOSIS — R110 Nausea: Secondary | ICD-10-CM | POA: Diagnosis not present

## 2021-08-28 DIAGNOSIS — C021 Malignant neoplasm of border of tongue: Secondary | ICD-10-CM | POA: Diagnosis not present

## 2021-08-28 DIAGNOSIS — C029 Malignant neoplasm of tongue, unspecified: Secondary | ICD-10-CM | POA: Diagnosis not present

## 2021-08-28 MED ORDER — HYDROMORPHONE HCL 2 MG PO TABS
2.0000 mg | ORAL_TABLET | ORAL | 0 refills | Status: DC
  Filled 2021-08-28: qty 60, 10d supply, fill #0

## 2021-08-28 MED ORDER — SUCRALFATE 1 G PO TABS
ORAL_TABLET | ORAL | 0 refills | Status: DC
  Filled 2021-08-28: qty 40, 10d supply, fill #0
  Filled 2021-08-28: qty 420, 10d supply, fill #0

## 2021-08-29 DIAGNOSIS — C021 Malignant neoplasm of border of tongue: Secondary | ICD-10-CM | POA: Diagnosis not present

## 2021-08-29 DIAGNOSIS — E860 Dehydration: Secondary | ICD-10-CM | POA: Diagnosis not present

## 2021-08-29 DIAGNOSIS — R112 Nausea with vomiting, unspecified: Secondary | ICD-10-CM | POA: Diagnosis not present

## 2021-08-30 DIAGNOSIS — C021 Malignant neoplasm of border of tongue: Secondary | ICD-10-CM | POA: Diagnosis not present

## 2021-08-30 DIAGNOSIS — E860 Dehydration: Secondary | ICD-10-CM | POA: Diagnosis not present

## 2021-08-30 DIAGNOSIS — R630 Anorexia: Secondary | ICD-10-CM | POA: Diagnosis not present

## 2021-08-30 DIAGNOSIS — C029 Malignant neoplasm of tongue, unspecified: Secondary | ICD-10-CM | POA: Diagnosis not present

## 2021-08-30 DIAGNOSIS — R638 Other symptoms and signs concerning food and fluid intake: Secondary | ICD-10-CM | POA: Diagnosis not present

## 2021-08-30 DIAGNOSIS — K1233 Oral mucositis (ulcerative) due to radiation: Secondary | ICD-10-CM | POA: Diagnosis not present

## 2021-09-06 ENCOUNTER — Other Ambulatory Visit (HOSPITAL_COMMUNITY): Payer: Self-pay

## 2021-09-06 DIAGNOSIS — R638 Other symptoms and signs concerning food and fluid intake: Secondary | ICD-10-CM | POA: Diagnosis not present

## 2021-09-06 DIAGNOSIS — R000 Tachycardia, unspecified: Secondary | ICD-10-CM | POA: Diagnosis not present

## 2021-09-06 DIAGNOSIS — R110 Nausea: Secondary | ICD-10-CM | POA: Diagnosis not present

## 2021-09-06 DIAGNOSIS — C029 Malignant neoplasm of tongue, unspecified: Secondary | ICD-10-CM | POA: Diagnosis not present

## 2021-09-06 DIAGNOSIS — K5900 Constipation, unspecified: Secondary | ICD-10-CM | POA: Diagnosis not present

## 2021-09-06 DIAGNOSIS — R5383 Other fatigue: Secondary | ICD-10-CM | POA: Diagnosis not present

## 2021-09-06 DIAGNOSIS — C021 Malignant neoplasm of border of tongue: Secondary | ICD-10-CM | POA: Diagnosis not present

## 2021-09-06 DIAGNOSIS — K1233 Oral mucositis (ulcerative) due to radiation: Secondary | ICD-10-CM | POA: Diagnosis not present

## 2021-09-06 MED ORDER — HYDROMORPHONE HCL 2 MG PO TABS
ORAL_TABLET | ORAL | 0 refills | Status: DC
  Filled 2021-09-06: qty 60, 5d supply, fill #0

## 2021-09-17 DIAGNOSIS — C029 Malignant neoplasm of tongue, unspecified: Secondary | ICD-10-CM | POA: Diagnosis not present

## 2021-09-17 DIAGNOSIS — R1312 Dysphagia, oropharyngeal phase: Secondary | ICD-10-CM | POA: Diagnosis not present

## 2021-09-17 DIAGNOSIS — R490 Dysphonia: Secondary | ICD-10-CM | POA: Diagnosis not present

## 2021-09-18 ENCOUNTER — Other Ambulatory Visit: Payer: Self-pay | Admitting: Family Medicine

## 2021-09-18 ENCOUNTER — Other Ambulatory Visit (HOSPITAL_COMMUNITY): Payer: Self-pay

## 2021-09-18 DIAGNOSIS — B370 Candidal stomatitis: Secondary | ICD-10-CM

## 2021-09-18 MED ORDER — FLUCONAZOLE 150 MG PO TABS
150.0000 mg | ORAL_TABLET | Freq: Every day | ORAL | 1 refills | Status: DC
  Filled 2021-09-18: qty 14, 14d supply, fill #0

## 2021-09-26 DIAGNOSIS — R490 Dysphonia: Secondary | ICD-10-CM | POA: Diagnosis not present

## 2021-09-26 DIAGNOSIS — R1311 Dysphagia, oral phase: Secondary | ICD-10-CM | POA: Diagnosis not present

## 2021-09-26 DIAGNOSIS — K148 Other diseases of tongue: Secondary | ICD-10-CM | POA: Diagnosis not present

## 2021-09-26 DIAGNOSIS — C029 Malignant neoplasm of tongue, unspecified: Secondary | ICD-10-CM | POA: Diagnosis not present

## 2021-09-26 NOTE — Progress Notes (Deleted)
[facility] Phone: [phone] Subjective:    I'm seeing this patient by the request  of:  Copland, Gay Filler, MD  CC:   [id number]  Christina Goodwin is a 21 y.o. female coming in with complaint of ?       Past Medical History:  Diagnosis Date   Fracture of fourth toe, left, closed 12/17/2011   Fracture tibia/fibula 2010   Left (s/p a fall--not sports related)   H/O finger fracture    History of concussion approx age 62   Fell off couch due to laughing too hard; had n/v and HA x 24h---no return of sx's since that time.   Osteopetrosis    Right rib fracture    2 ribs fractured in 2 spots with small pneumothorax--f/u cxr good.   Past Surgical History:  Procedure Laterality Date   FRACTURE SURGERY  2010   tib/fib repair and pin removal (left); 2 surgeries   Social History   Socioeconomic History   Marital status: Single    Spouse name: Not on file   Number of children: Not on file   Years of education: Not on file   Highest education level: Not on file  Occupational History   Not on file  Tobacco Use   Smoking status: Never   Smokeless tobacco: Never  Vaping Use   Vaping Use: Never used  Substance and Sexual Activity   Alcohol use: No   Drug use: No   Sexual activity: Not on file  Other Topics Concern   Not on file  Social History Narrative   She is about to be sophomore in college    Lives with parents   Right handed   Caffeine: none             Social Determinants of Health   Financial Resource Strain: Not on file  Food Insecurity: Not on file  Transportation Needs: Not on file  Physical Activity: Not on file  Stress: Not on file  Social Connections: Not on file   Allergies  Allergen Reactions   Hydrocodone Hives    "Red Spots" 05/14/21 per mom pt has nausea and not true allergy   Family History  Problem Relation Age of Onset   Migraines Mother        with  puberty   Headache Father    Migraines Sister    Migraines Maternal Grandmother        worse perimenopausal   Migraines Paternal Grandmother    Migraines Sister        not as bad as the pt & sister [name]   Other Paternal Grandfather        osteopetrosis   Migraines Other     Current Outpatient Medications (Endocrine & Metabolic):    etonogestrel (NEXPLANON) 43 MG IMPL implant, 1 each by Subdermal route once.   Current Outpatient Medications (Respiratory):    promethazine (PHENERGAN) 25 MG tablet, Take 1 tablet (25 mg total) by mouth every 8 (eight) hours as needed for nausea or vomiting.  Current Outpatient Medications (Analgesics):    Acetaminophen 650 MG/20.3ML SOLN, Take 20.3 mLs (650 mg total) by mouth every 4 hours for 14 days.   celecoxib (CELEBREX) 200 MG capsule, Take 1 capsule (200 mg total) by mouth 2 times daily for 14 days.   HYDROcodone-acetaminophen (NORCO/VICODIN) 5-325 MG tablet, TAKE 1 TABLET BY MOUTH EVERY 4 TO 6 HOURS AS NEEDED FOR PAIN.  HYDROmorphone (DILAUDID) 2 MG tablet, Take 1 - 2 tablets by mouth every 4 hours as needed for moderate pain (4-6) or severe pain (7-10).   ibuprofen (ADVIL) 400 MG tablet, TAKE 1 TABLET BY MOUTH EVERY 4 HOURS AS NEEDED FOR PAIN. NO MORE THAN 6 TABLETS PER DAY.   oxyCODONE (OXY IR/ROXICODONE) 5 MG immediate release tablet, Take 1 tablet by mouth every 6 hours as needed for up to 30 days.   SUMAtriptan (IMITREX) 50 MG tablet, TAKE 1 TABLET BY MOUTH AT START OF MIGRAINE MAY REPEAT IN 2 HOURS IF HEADACHE PERSISTS OR RECURS *MAX 2 TABS IN 24 HOURS*   Current Outpatient Medications (Other):    ALPRAZolam (XANAX) 0.5 MG tablet, Take 1/2 or 1 tablet by mouth up to three times a day as needed for anxiety. Start with 1/2 tablet(0.25 mg).   COVID-19 At Home Antigen Test [facility] COVID-19 HOME TEST) KIT, Use as directed per package instructions   COVID-19 At Home Antigen Test (CARESTART COVID-19 HOME TEST) KIT, Use as directed.    doxycycline (VIBRAMYCIN) 100 MG capsule, Take 100 mg by mouth 2 (two) times daily.   fluconazole (DIFLUCAN) 100 MG tablet, Take 1 tablet (100 mg total) by mouth daily. Take 200 mg by mouth once, then 100 mg by mouth daily for 7- 14 days total   fluconazole (DIFLUCAN) 100 MG tablet, Take 1 tablet by mouth once a day   fluconazole (DIFLUCAN) 150 MG tablet, Take 1 tablet (150 mg total) by mouth daily. Take daily for 7- 14 days for oral thrush   lidocaine (XYLOCAINE) 2 % solution, Swish 5 mL in mouth and spit out two to three times daily. Do not swallow it, do not eat for 60 minutes after rinsing.   lidocaine solution-diphenhydrAMINE liquid-nystatin suspension-alum & mag hydroxide-simeth suspension, Take 30 mLs by mouth 4 times daily for 14 days   lisdexamfetamine (VYVANSE) 20 MG capsule, Take 1 capsule (20 mg total) by mouth daily.   ondansetron (ZOFRAN) 8 MG tablet, Take 1 tablet (8 mg total) by mouth every 8 (eight) hours as needed for nausea or vomiting.   ondansetron (ZOFRAN) 8 MG tablet, Take 1 tablet  by mouth every 8  hours as needed for nausea or vomiting   polyethylene glycol (MIRALAX / GLYCOLAX) 17 g packet, Mix 17 grams with 4-8 ounces of liquid and take by mouth daily for 14 days.   prochlorperazine (COMPAZINE) 10 MG tablet, Take 1 tablet (10 mg total) by mouth every 6 (six) hours as needed for nausea (or vomiting that does not respond to ondansetron [Zofran]).   scopolamine (TRANSDERM-SCOP) 1 MG/3DAYS, Place 1 patch (1.5 mg total) onto the skin every 3 (three) days. Use as needed for nausea and vomiting   sodium fluoride (FLUORISHIELD) 1.1 % GEL dental gel, Place 1 drop in each space of tray and insert after brushing/flossing. Wear for 10 mins at bedtime, remove and spit out excess. Do not eat, drink, or rinse for 30 mins.   sucralfate (CARAFATE) 1 g tablet, Dissolve 1 tablet in at least 46ms of water and take by mouth four times a day.   Reviewed prior external information including notes  and imaging from  primary care provider As well as notes that were available from care everywhere and other healthcare systems.  Past medical history, social, surgical and family history all reviewed in electronic medical record.  No pertanent information unless stated regarding to the chief complaint.   Review of Systems:  No headache, visual changes,  nausea, vomiting, diarrhea, constipation, dizziness, abdominal pain, skin rash, fevers, chills, night sweats, weight loss, swollen lymph nodes, body aches, joint swelling, chest pain, shortness of breath, mood changes. POSITIVE muscle aches  Objective  There were no vitals taken for this visit.   General: No apparent distress alert and oriented x3 mood and affect normal, dressed appropriately.  HEENT: Pupils equal, extraocular movements intact  Respiratory: Patient's speak in full sentences and does not appear short of breath  Cardiovascular: No lower extremity edema, non tender, no erythema      Impression and Recommendations:

## 2021-09-27 ENCOUNTER — Ambulatory Visit: Payer: 59 | Admitting: Family Medicine

## 2021-10-03 DIAGNOSIS — R490 Dysphonia: Secondary | ICD-10-CM | POA: Diagnosis not present

## 2021-10-03 DIAGNOSIS — C029 Malignant neoplasm of tongue, unspecified: Secondary | ICD-10-CM | POA: Diagnosis not present

## 2021-10-03 DIAGNOSIS — R1312 Dysphagia, oropharyngeal phase: Secondary | ICD-10-CM | POA: Diagnosis not present

## 2021-10-16 DIAGNOSIS — R1312 Dysphagia, oropharyngeal phase: Secondary | ICD-10-CM | POA: Diagnosis not present

## 2021-10-30 DIAGNOSIS — Z9221 Personal history of antineoplastic chemotherapy: Secondary | ICD-10-CM | POA: Diagnosis not present

## 2021-10-30 DIAGNOSIS — R1312 Dysphagia, oropharyngeal phase: Secondary | ICD-10-CM | POA: Diagnosis not present

## 2021-10-30 DIAGNOSIS — Z87891 Personal history of nicotine dependence: Secondary | ICD-10-CM | POA: Diagnosis not present

## 2021-10-30 DIAGNOSIS — Z923 Personal history of irradiation: Secondary | ICD-10-CM | POA: Diagnosis not present

## 2021-10-30 DIAGNOSIS — Z9049 Acquired absence of other specified parts of digestive tract: Secondary | ICD-10-CM | POA: Diagnosis not present

## 2021-10-30 DIAGNOSIS — C029 Malignant neoplasm of tongue, unspecified: Secondary | ICD-10-CM | POA: Diagnosis not present

## 2021-10-30 DIAGNOSIS — I890 Lymphedema, not elsewhere classified: Secondary | ICD-10-CM | POA: Diagnosis not present

## 2021-11-04 DIAGNOSIS — I890 Lymphedema, not elsewhere classified: Secondary | ICD-10-CM | POA: Diagnosis not present

## 2021-11-05 NOTE — Progress Notes (Addendum)
[facility] at [facility] [address] [place], [place] [address] [phone]  Date:  11/07/2021   Name:  Christina Goodwin   DOB:  June 06, 2000   MRN:  [id number]  PCP:  Pearline Cables, MD    Chief Complaint: Follow-up (Concerns/ questions: Nexplanon removal, Hoarseness x 1 month/)   History of Present Illness:  Christina Goodwin is a 21 y.o. very pleasant female patient who presents with the following:  Young woman here today for physical exam l She is a Chief Strategy Officer at [employer], majoring in music and voice History of migraine headache, acne. Lania had been generally healthy until she was noted to have a tongue lesion earlier this year, first noted in December or January.  She had a biopsy per oral surgery and very unfortunatey was diagnosed with squamous cell carcinoma  Her treatment has been through [facility] Her medical oncologist is Dr. Si Raider Radiation oncologist Dr. Purvis Sheffield Her most recent visit with hematology was on 7/19-she has completed surgery, radiation and chemotherapy.  She does have a PET/CT scheduled on 8/9 which will help determine any next steps.  The whole family is of course anxious to get this result.  Assuming negative, her treatment is hopefully complete and she can get back to normal life Recent hematology note:  1) Oral cavity SqCC, stage IVA (pT3N2b cM0). - Biopsy of left lateral tongue 05/23/21 [id number] at [facility]: Moderately well-differentiated squamous cell carcinoma (SqCC).  - Surgical pathology 06/06/21 [phone]: 2.5cm squamous cell carcinoma, conventional (keratinizing) type, moderately differentiated, negative margins, 12mm DOI, +PNI, no LVI, close 2mm deep margin, read as pT3NX. - Surgical pathology 06/17/21 [phone]: Carcinoma in two nodes at left neck level 2A up to 0.9cm with +CK5/6, +p40; overall +2/39 nodes with negative nodes at levels right level 1a and left  levels 1b, 2b, and 4.  TREATMENTS: - 06/06/21: Left partial glossectomy with floor of mouth resection (R0). - 06/17/21: Left selective neck dissection of levels 1a/1b/2a/2b/3/4/5b (R0). - 07/15/21 - 08/23/21: Adjuvant radiation to oral cavity and left neck 60Gy; elective right neck 56Gy  - 07/17/21 - 08/14/21: Adjuvant cisplatin weekly 40mg /m2 concurrent with radiation (completed 200mg /m2, complicated by G3 mucositis).  - 08/24/21 - CURRENT: Surveillance.  CANCER STATUS: No evidence of disease s/p R0 resection and chemoradiation. CURRENT REGIMEN: None.  Currently has Nexplanon for contraception.  She is not sexually active right now, would like to have the Nexplanon removed-it is expiring soon anyway.  We discussed contraceptive options, she would like something to regulate her menses and possibly help with her complexion.  However, she does get migraine headaches so progesterone only is likely safer option for her Can offer STI screening-not needed at this time Meningitis, HPV up-to-date  [name] has noticed hoarseness in the morning, it tends to get better after a couple of hours.  Her voice coach thought it might be GERD related She has added famotidine at night for reflux it is helping some, but she still is struggling with some hoarseness  As far as mood,Namiah feels that she is doing okay.  She hopes she is through the worst of it.  She would like to have a refill of her alprazolam to have on hand.  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   Osteopetrosis    Right rib fracture    2 ribs fractured in 2 spots with small pneumothorax--f/u cxr good.    Past Surgical History:  Procedure Laterality Date   FRACTURE SURGERY  2010   tib/fib repair and pin removal (left); 2 surgeries    Social History   Tobacco Use   Smoking status: Never   Smokeless tobacco: Never  Vaping Use   Vaping Use: Never used  Substance Use Topics   Alcohol use: No   Drug use: No    Family History  Problem Relation Age of Onset   Migraines Mother        with puberty   Headache Father    Migraines Sister    Migraines Maternal Grandmother        worse perimenopausal   Migraines Paternal Grandmother    Migraines Sister        not as bad as the pt & sister [name]   Other Paternal Grandfather        osteopetrosis   Migraines Other     Allergies  Allergen Reactions   Hydrocodone Hives    "Red Spots" 05/14/21 per mom pt has nausea and not true allergy    Medication list has been reviewed and updated.  Current Outpatient Medications on File Prior to Visit  Medication Sig Dispense Refill   etonogestrel (NEXPLANON) 68 MG IMPL implant 1 each by Subdermal route once.     fluconazole (DIFLUCAN) 100 MG tablet Take 1 tablet by mouth once a day 11 tablet 0   HYDROcodone-acetaminophen (NORCO/VICODIN) 5-325 MG tablet TAKE 1 TABLET BY MOUTH EVERY 4 TO 6 HOURS AS NEEDED FOR PAIN. 5 tablet 0   HYDROmorphone (DILAUDID) 2 MG tablet Take 1 - 2 tablets by mouth every 4 hours as needed for moderate pain (4-6) or severe pain (7-10). 60 tablet 0   ondansetron (ZOFRAN) 8 MG tablet Take 1 tablet (8 mg total) by mouth every 8 (eight) hours as needed for nausea or vomiting. 50 tablet 0   prochlorperazine (COMPAZINE) 10 MG tablet Take 1 tablet (10 mg total) by  mouth every 6 (six) hours as needed for nausea (or vomiting that does not respond to ondansetron [Zofran]). 30 tablet 2   promethazine (PHENERGAN) 25 MG tablet Take 1 tablet (25 mg total) by mouth every 8 (eight) hours as needed for nausea or vomiting. 30 tablet 1   sodium fluoride (FLUORISHIELD) 1.1 % GEL dental gel Place 1 drop in each space of tray and insert after brushing/flossing. Wear for 10 mins at bedtime, remove and spit out excess. Do not eat, drink, or rinse for 30 mins. 56 g 5   [DISCONTINUED] sucralfate (CARAFATE) 1 g tablet Dissolve 1 tablet in at least of water and take by mouth four times a day. 40 tablet 0   No current facility-administered medications on file prior to visit.    Review of Systems:  As per HPI- otherwise negative.   Physical Examination: Vitals:   11/07/21 1315  BP: 110/60  Pulse: 94  Resp: 18  Temp: 97.7 F (36.5 C)  SpO2: 98%   Vitals:   11/07/21 1315  Weight: 146 lb 6.4 oz (66.4 kg)  Height: 5\' 6"  (1.676 m)   Body mass index is 23.63 kg/m. Ideal Body Weight: Weight in (lb) to have BMI = 25: 154.6  GEN: no acute distress.  Normal weight, looks well HEENT: Atraumatic, Normocephalic.  Bilateral TM wnl, oropharynx normal.  PEERL,EOMI. well-healed scar over anterior neck.  There is still some fullness around the left jaw, she is not able to open her mouth completely as of yet Mild acne is present over bilateral cheeks Ears and Nose: No external deformity. CV: RRR, No M/G/R. No JVD. No thrill. No extra heart sounds. PULM: CTA B, no wheezes, crackles, rhonchi. No retractions. No resp. distress. No accessory muscle use. ABD: S, NT, ND No rebound. No HSM. EXTR: No c/c/e PSYCH: Normally interactive. Conversant.    Assessment and Plan: Physical exam  Attention deficit - Plan: lisdexamfetamine (VYVANSE) 20 MG capsule  Squamous cell carcinoma in situ (SCCIS) of tongue  Migraine with aura and with status migrainosus, not intractable -  Plan: SUMAtriptan (IMITREX) 50 MG tablet  Acne vulgaris - Plan: Ambulatory referral to Dermatology, tretinoin (RETIN-A) 0.05 % cream  Adjustment disorder with anxious mood - Plan: ALPRAZolam (XANAX) 0.5 MG tablet  Gastroesophageal reflux disease, unspecified whether esophagitis present - Plan: omeprazole (PRILOSEC) 20 MG capsule  Patient seen today for physical exam  As above, earlier this year she was diagnosed with squamous cell carcinoma of her tongue and has undergone some surgery, radiation and chemotherapy.  No current treatment, she has a follow-up scan in about 2 weeks Refill Imitrex for headaches as needed Refill alprazolam to have on hand She notes reflux symptoms, currently taking famotidine but still having symptoms.  We will try to stepping up her therapy to omeprazole for a month.  Advised that some of this hoarseness may be due to her recent treatment which of course she understands  Also prescribed tretinoin cream to use as needed for acne, made referral to dermatology.  Discussed use of tretinoin, need to start gradually and minimize sun while on this treatment  Made appointment in about 10 days for Nexplanon removal and Pap smear  Signed Abbe Amsterdam, MD

## 2021-11-07 ENCOUNTER — Encounter: Payer: Self-pay | Admitting: Family Medicine

## 2021-11-07 ENCOUNTER — Ambulatory Visit (INDEPENDENT_AMBULATORY_CARE_PROVIDER_SITE_OTHER): Payer: 59 | Admitting: Family Medicine

## 2021-11-07 ENCOUNTER — Other Ambulatory Visit (HOSPITAL_BASED_OUTPATIENT_CLINIC_OR_DEPARTMENT_OTHER): Payer: Self-pay

## 2021-11-07 VITALS — BP 110/60 | HR 94 | Temp 97.7°F | Resp 18 | Ht 66.0 in | Wt 146.4 lb

## 2021-11-07 DIAGNOSIS — K219 Gastro-esophageal reflux disease without esophagitis: Secondary | ICD-10-CM | POA: Diagnosis not present

## 2021-11-07 DIAGNOSIS — F4322 Adjustment disorder with anxiety: Secondary | ICD-10-CM

## 2021-11-07 DIAGNOSIS — G43101 Migraine with aura, not intractable, with status migrainosus: Secondary | ICD-10-CM | POA: Diagnosis not present

## 2021-11-07 DIAGNOSIS — L700 Acne vulgaris: Secondary | ICD-10-CM

## 2021-11-07 DIAGNOSIS — R41840 Attention and concentration deficit: Secondary | ICD-10-CM | POA: Diagnosis not present

## 2021-11-07 DIAGNOSIS — Z0000 Encounter for general adult medical examination without abnormal findings: Secondary | ICD-10-CM | POA: Diagnosis not present

## 2021-11-07 DIAGNOSIS — D0007 Carcinoma in situ of tongue: Secondary | ICD-10-CM | POA: Diagnosis not present

## 2021-11-07 MED ORDER — SUMATRIPTAN SUCCINATE 50 MG PO TABS
ORAL_TABLET | ORAL | 3 refills | Status: DC
  Filled 2021-11-07: qty 10, 17d supply, fill #0

## 2021-11-07 MED ORDER — LISDEXAMFETAMINE DIMESYLATE 20 MG PO CAPS
20.0000 mg | ORAL_CAPSULE | Freq: Every day | ORAL | 0 refills | Status: DC
  Filled 2021-11-07: qty 30, 30d supply, fill #0

## 2021-11-07 MED ORDER — TRETINOIN 0.05 % EX CREA
TOPICAL_CREAM | Freq: Every day | CUTANEOUS | 0 refills | Status: AC
  Filled 2021-11-07: qty 45, 30d supply, fill #0

## 2021-11-07 MED ORDER — OMEPRAZOLE 20 MG PO CPDR
20.0000 mg | DELAYED_RELEASE_CAPSULE | Freq: Every day | ORAL | 3 refills | Status: DC
  Filled 2021-11-07: qty 30, 30d supply, fill #0

## 2021-11-07 MED ORDER — ALPRAZOLAM 0.5 MG PO TABS
0.2500 mg | ORAL_TABLET | Freq: Three times a day (TID) | ORAL | 0 refills | Status: AC | PRN
  Filled 2021-11-07: qty 30, 10d supply, fill #0

## 2021-11-07 NOTE — Patient Instructions (Signed)
Great to see you today!   We will try  -omeprazole for your reflux, try for a month or so and let me know -conservative use of retinoid cream for acne  Referral to dermatology  I can do your pap at your convenience

## 2021-11-08 ENCOUNTER — Other Ambulatory Visit (HOSPITAL_BASED_OUTPATIENT_CLINIC_OR_DEPARTMENT_OTHER): Payer: Self-pay

## 2021-11-17 NOTE — Progress Notes (Deleted)
[place] at [facility] [address] [place], [place] [address] [phone]  Date:  11/18/2021   Name:  AMARA JUSTEN   DOB:  [birth year]   MRN:  [id number]  PCP:  Darreld Mclean, MD    Chief Complaint: No chief complaint on file.   History of Present Illness:  Christina Goodwin is a 21 y.o. 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   Osteopetrosis    Right rib fracture    2 ribs fractured in 2 spots with small pneumothorax--f/u cxr good.    Past Surgical History:  Procedure Laterality Date   FRACTURE SURGERY  2010   tib/fib repair and pin removal (left); 2 surgeries    Social History   Tobacco Use   Smoking status: Never   Smokeless tobacco: Never  Vaping Use   Vaping Use: Never used  Substance Use Topics   Alcohol use: No   Drug use: No    Family History  Problem Relation Age of Onset   Migraines Mother        with puberty   Headache Father    Migraines Sister    Migraines Maternal Grandmother        worse perimenopausal   Migraines Paternal 46    Migraines Sister        not as bad as the pt & sister [name]   Other Paternal Grandfather        osteopetrosis    Migraines Other     Allergies  Allergen Reactions   Hydrocodone Hives    "Red Spots" 05/14/21 per mom pt has nausea and not true allergy    Medication list has been reviewed and updated.  Current Outpatient Medications on File Prior to Visit  Medication Sig Dispense Refill   ALPRAZolam (XANAX) 0.5 MG tablet Take 1/2 - 1 tablet (0.25-0.5 mg total) by mouth 3 (three) times daily as needed for anxiety. Start with 1/2 tablet (0.25 mg). 30 tablet 0   etonogestrel (NEXPLANON) 68 MG IMPL implant 1 each by Subdermal route once.     fluconazole (DIFLUCAN) 100 MG tablet Take 1 tablet by mouth once a day 11 tablet 0   HYDROcodone-acetaminophen (NORCO/VICODIN) 5-325 MG tablet TAKE 1 TABLET BY MOUTH EVERY 4 TO 6 HOURS AS NEEDED FOR PAIN. 5 tablet 0   HYDROmorphone (DILAUDID) 2 MG tablet Take 1 - 2 tablets by mouth every 4 hours as needed for moderate pain (4-6) or severe pain (7-10). 60 tablet 0   lisdexamfetamine (VYVANSE) 20 MG capsule Take 1 capsule (20 mg total) by mouth daily. 30 capsule 0   omeprazole (PRILOSEC) 20  MG capsule Take 1 capsule (20 mg total) by mouth daily. 30 capsule 3   ondansetron (ZOFRAN) 8 MG tablet Take 1 tablet (8 mg total) by mouth every 8 (eight) hours as needed for nausea or vomiting. 50 tablet 0   prochlorperazine (COMPAZINE) 10 MG tablet Take 1 tablet (10 mg total) by mouth every 6 (six) hours as needed for nausea (or vomiting that does not respond to ondansetron [Zofran]). 30 tablet 2   promethazine (PHENERGAN) 25 MG tablet Take 1 tablet (25 mg total) by mouth every 8 (eight) hours as needed for nausea or vomiting. 30 tablet 1   sodium fluoride (FLUORISHIELD) 1.1 % GEL dental gel Place 1 drop in each space of tray and insert after brushing/flossing. Wear for 10 mins at bedtime, remove and spit out excess. Do not eat, drink, or rinse for 30 mins. 56 g 5   SUMAtriptan (IMITREX) 50 MG tablet TAKE 1 TABLET BY MOUTH AT START OF MIGRAINE MAY REPEAT IN 2 HOURS IF HEADACHE PERSISTS  OR RECURS *MAX 2 TABS IN 24 HOURS* 10 tablet 3   tretinoin (RETIN-A) 0.05 % cream Apply topically at bedtime. Use as needed 45 g 0   [DISCONTINUED] sucralfate (CARAFATE) 1 g tablet Dissolve 1 tablet in at least 84ms of water and take by mouth four times a day. 40 tablet 0   No current facility-administered medications on file prior to visit.    Review of Systems:  ***  Physical Examination: There were no vitals filed for this visit. There were no vitals filed for this visit. There is no height or weight on file to calculate BMI. Ideal Body Weight:    ***  Assessment and Plan: ***  Signed JLamar Blinks MD

## 2021-11-18 ENCOUNTER — Ambulatory Visit: Payer: 59 | Admitting: Family Medicine

## 2021-11-20 DIAGNOSIS — I890 Lymphedema, not elsewhere classified: Secondary | ICD-10-CM | POA: Diagnosis not present

## 2021-11-20 DIAGNOSIS — Z9089 Acquired absence of other organs: Secondary | ICD-10-CM | POA: Diagnosis not present

## 2021-11-20 DIAGNOSIS — C028 Malignant neoplasm of overlapping sites of tongue: Secondary | ICD-10-CM | POA: Diagnosis not present

## 2021-11-20 DIAGNOSIS — L988 Other specified disorders of the skin and subcutaneous tissue: Secondary | ICD-10-CM | POA: Diagnosis not present

## 2021-11-20 DIAGNOSIS — C029 Malignant neoplasm of tongue, unspecified: Secondary | ICD-10-CM | POA: Diagnosis not present

## 2021-11-20 DIAGNOSIS — Q782 Osteopetrosis: Secondary | ICD-10-CM | POA: Diagnosis not present

## 2021-11-20 DIAGNOSIS — Z923 Personal history of irradiation: Secondary | ICD-10-CM | POA: Diagnosis not present

## 2021-11-21 DIAGNOSIS — C029 Malignant neoplasm of tongue, unspecified: Secondary | ICD-10-CM | POA: Diagnosis not present

## 2021-11-22 DIAGNOSIS — C029 Malignant neoplasm of tongue, unspecified: Secondary | ICD-10-CM | POA: Diagnosis not present

## 2021-11-22 DIAGNOSIS — Z808 Family history of malignant neoplasm of other organs or systems: Secondary | ICD-10-CM | POA: Diagnosis not present

## 2021-11-22 DIAGNOSIS — Z8041 Family history of malignant neoplasm of ovary: Secondary | ICD-10-CM | POA: Diagnosis not present

## 2021-12-13 ENCOUNTER — Other Ambulatory Visit (HOSPITAL_BASED_OUTPATIENT_CLINIC_OR_DEPARTMENT_OTHER): Payer: Self-pay

## 2021-12-31 DIAGNOSIS — Z9221 Personal history of antineoplastic chemotherapy: Secondary | ICD-10-CM | POA: Diagnosis not present

## 2021-12-31 DIAGNOSIS — I890 Lymphedema, not elsewhere classified: Secondary | ICD-10-CM | POA: Diagnosis not present

## 2021-12-31 DIAGNOSIS — C029 Malignant neoplasm of tongue, unspecified: Secondary | ICD-10-CM | POA: Diagnosis not present

## 2021-12-31 DIAGNOSIS — Z923 Personal history of irradiation: Secondary | ICD-10-CM | POA: Diagnosis not present

## 2022-01-08 DIAGNOSIS — I890 Lymphedema, not elsewhere classified: Secondary | ICD-10-CM | POA: Diagnosis not present

## 2022-01-08 DIAGNOSIS — Z923 Personal history of irradiation: Secondary | ICD-10-CM | POA: Diagnosis not present

## 2022-01-08 DIAGNOSIS — C029 Malignant neoplasm of tongue, unspecified: Secondary | ICD-10-CM | POA: Diagnosis not present

## 2022-01-08 DIAGNOSIS — Z9221 Personal history of antineoplastic chemotherapy: Secondary | ICD-10-CM | POA: Diagnosis not present

## 2022-01-09 ENCOUNTER — Other Ambulatory Visit (HOSPITAL_BASED_OUTPATIENT_CLINIC_OR_DEPARTMENT_OTHER): Payer: Self-pay

## 2022-01-14 DIAGNOSIS — Z9221 Personal history of antineoplastic chemotherapy: Secondary | ICD-10-CM | POA: Diagnosis not present

## 2022-01-14 DIAGNOSIS — Z923 Personal history of irradiation: Secondary | ICD-10-CM | POA: Diagnosis not present

## 2022-01-14 DIAGNOSIS — I890 Lymphedema, not elsewhere classified: Secondary | ICD-10-CM | POA: Diagnosis not present

## 2022-01-14 DIAGNOSIS — C029 Malignant neoplasm of tongue, unspecified: Secondary | ICD-10-CM | POA: Diagnosis not present

## 2022-01-20 ENCOUNTER — Ambulatory Visit: Payer: 59 | Admitting: Family Medicine

## 2022-01-21 DIAGNOSIS — I890 Lymphedema, not elsewhere classified: Secondary | ICD-10-CM | POA: Diagnosis not present

## 2022-01-21 DIAGNOSIS — Z9221 Personal history of antineoplastic chemotherapy: Secondary | ICD-10-CM | POA: Diagnosis not present

## 2022-01-21 DIAGNOSIS — C029 Malignant neoplasm of tongue, unspecified: Secondary | ICD-10-CM | POA: Diagnosis not present

## 2022-01-21 DIAGNOSIS — Z923 Personal history of irradiation: Secondary | ICD-10-CM | POA: Diagnosis not present

## 2022-01-23 DIAGNOSIS — C029 Malignant neoplasm of tongue, unspecified: Secondary | ICD-10-CM | POA: Diagnosis not present

## 2022-01-28 DIAGNOSIS — Z923 Personal history of irradiation: Secondary | ICD-10-CM | POA: Diagnosis not present

## 2022-01-28 DIAGNOSIS — I890 Lymphedema, not elsewhere classified: Secondary | ICD-10-CM | POA: Diagnosis not present

## 2022-01-28 DIAGNOSIS — C029 Malignant neoplasm of tongue, unspecified: Secondary | ICD-10-CM | POA: Diagnosis not present

## 2022-01-28 DIAGNOSIS — Z9221 Personal history of antineoplastic chemotherapy: Secondary | ICD-10-CM | POA: Diagnosis not present

## 2022-02-04 DIAGNOSIS — Z923 Personal history of irradiation: Secondary | ICD-10-CM | POA: Diagnosis not present

## 2022-02-04 DIAGNOSIS — C029 Malignant neoplasm of tongue, unspecified: Secondary | ICD-10-CM | POA: Diagnosis not present

## 2022-02-04 DIAGNOSIS — I890 Lymphedema, not elsewhere classified: Secondary | ICD-10-CM | POA: Diagnosis not present

## 2022-02-04 DIAGNOSIS — Z9221 Personal history of antineoplastic chemotherapy: Secondary | ICD-10-CM | POA: Diagnosis not present

## 2022-02-25 DIAGNOSIS — C029 Malignant neoplasm of tongue, unspecified: Secondary | ICD-10-CM | POA: Diagnosis not present

## 2022-03-09 ENCOUNTER — Other Ambulatory Visit: Payer: Self-pay | Admitting: Family Medicine

## 2022-03-09 DIAGNOSIS — R41840 Attention and concentration deficit: Secondary | ICD-10-CM

## 2022-03-10 ENCOUNTER — Other Ambulatory Visit (HOSPITAL_BASED_OUTPATIENT_CLINIC_OR_DEPARTMENT_OTHER): Payer: Self-pay

## 2022-03-10 MED ORDER — LISDEXAMFETAMINE DIMESYLATE 20 MG PO CAPS
20.0000 mg | ORAL_CAPSULE | Freq: Every day | ORAL | 0 refills | Status: DC
  Filled 2022-03-10: qty 30, 30d supply, fill #0

## 2022-03-11 ENCOUNTER — Other Ambulatory Visit (HOSPITAL_BASED_OUTPATIENT_CLINIC_OR_DEPARTMENT_OTHER): Payer: Self-pay

## 2022-03-18 DIAGNOSIS — Z923 Personal history of irradiation: Secondary | ICD-10-CM | POA: Diagnosis not present

## 2022-03-18 DIAGNOSIS — C029 Malignant neoplasm of tongue, unspecified: Secondary | ICD-10-CM | POA: Diagnosis not present

## 2022-03-18 DIAGNOSIS — K117 Disturbances of salivary secretion: Secondary | ICD-10-CM | POA: Diagnosis not present

## 2022-03-18 DIAGNOSIS — R432 Parageusia: Secondary | ICD-10-CM | POA: Diagnosis not present

## 2022-03-18 DIAGNOSIS — R252 Cramp and spasm: Secondary | ICD-10-CM | POA: Diagnosis not present

## 2022-03-18 DIAGNOSIS — I890 Lymphedema, not elsewhere classified: Secondary | ICD-10-CM | POA: Diagnosis not present

## 2022-03-18 DIAGNOSIS — Z9221 Personal history of antineoplastic chemotherapy: Secondary | ICD-10-CM | POA: Diagnosis not present

## 2022-04-13 DIAGNOSIS — Z760 Encounter for issue of repeat prescription: Secondary | ICD-10-CM | POA: Diagnosis not present

## 2022-04-16 ENCOUNTER — Ambulatory Visit (INDEPENDENT_AMBULATORY_CARE_PROVIDER_SITE_OTHER): Payer: Commercial Managed Care - PPO | Admitting: Family Medicine

## 2022-04-16 ENCOUNTER — Encounter: Payer: Self-pay | Admitting: Family Medicine

## 2022-04-16 DIAGNOSIS — Z3046 Encounter for surveillance of implantable subdermal contraceptive: Secondary | ICD-10-CM

## 2022-04-16 MED ORDER — ETONOGESTREL 68 MG ~~LOC~~ IMPL
68.0000 mg | DRUG_IMPLANT | Freq: Once | SUBCUTANEOUS | Status: AC
  Administered 2022-04-16: 68 mg via SUBCUTANEOUS

## 2022-04-16 NOTE — Progress Notes (Signed)
CC: Nexplanon removal/reinsertion  Procedure note; Nexplanon removal Informed consent obtained. The patient was placed in a comfortable supine position and the non-dominant arm, left, was positioned to access the sulcus between the bicep and triceps muscles.  Measurements were made, and markings were placed at 8 cm, 10 cm, 12 cm and 14 cm from the medial epicondyle of the humerus.  The area was prepped with alcohol and a 27 gauge needle was used to inject 3 mL of 1% lidocaine with epinephrine.  While pressure was applied to the proximal side of the trocar, an 11-blade scalpel was used to make a small incision over the anesthetized area, parallel to the trocar. The trocar was visualized and scar tissue was dissected around it. It was grasped with a straight hemostat and removed.  The area was then cleaned with betadine. Sterile gloves were then utilized. The Nexplanon trocar was then inserted at the end of the open tissue and pushed gently through the subcutaneous tissue along the line of the sulcus.  The seal was broken and the implant was held in place while the trocar was withdrawn.  The implant was then palpated by both the patient and me.  The area was wrapped with a pressure bandage. Adequate hemostasis was obtained. The patient tolerated the procedure well. There were no complications noted.  Encounter for removal and reinsertion of Nexplanon  Successful removal and reinsertion today.  The patient palpated the implant as well as me.  Aftercare instructions verbalized.  Ice, Tylenol, ibuprofen.  Her mother is a physician at our office and will personally let me know if there are any issues.  The patient voiced understanding and agreement to the plan.  Christina Goodwin Christina Goodwin 12:11 PM 04/16/22

## 2022-04-16 NOTE — Addendum Note (Signed)
Addended by: Sharon Seller B on: 04/16/2022 12:34 PM   Modules accepted: Orders

## 2022-04-16 NOTE — Patient Instructions (Signed)
Ice/cold pack over area for 10-15 min twice daily.  OK to take Tylenol 1000 mg (2 extra strength tabs) or 975 mg (3 regular strength tabs) every 6 hours as needed.  Ibuprofen 400-600 mg (2-3 over the counter strength tabs) every 6 hours as needed for pain.  Use backup contraception for 7 days.   Let us know if you need anything.

## 2022-04-17 DIAGNOSIS — C029 Malignant neoplasm of tongue, unspecified: Secondary | ICD-10-CM | POA: Diagnosis not present

## 2022-04-18 ENCOUNTER — Ambulatory Visit: Payer: Commercial Managed Care - PPO | Admitting: Family Medicine

## 2022-05-09 ENCOUNTER — Other Ambulatory Visit (HOSPITAL_BASED_OUTPATIENT_CLINIC_OR_DEPARTMENT_OTHER): Payer: Self-pay

## 2022-05-09 ENCOUNTER — Other Ambulatory Visit (HOSPITAL_COMMUNITY): Payer: Self-pay

## 2022-05-09 ENCOUNTER — Encounter: Payer: Self-pay | Admitting: Family Medicine

## 2022-05-09 DIAGNOSIS — R41840 Attention and concentration deficit: Secondary | ICD-10-CM

## 2022-05-09 MED ORDER — LISDEXAMFETAMINE DIMESYLATE 20 MG PO CAPS
20.0000 mg | ORAL_CAPSULE | Freq: Every day | ORAL | 0 refills | Status: DC
  Filled 2022-05-09: qty 30, 30d supply, fill #0

## 2022-05-13 ENCOUNTER — Other Ambulatory Visit (HOSPITAL_BASED_OUTPATIENT_CLINIC_OR_DEPARTMENT_OTHER): Payer: Self-pay

## 2022-05-14 ENCOUNTER — Other Ambulatory Visit (HOSPITAL_BASED_OUTPATIENT_CLINIC_OR_DEPARTMENT_OTHER): Payer: Self-pay

## 2022-05-22 ENCOUNTER — Other Ambulatory Visit (HOSPITAL_COMMUNITY): Payer: Self-pay

## 2022-05-22 DIAGNOSIS — C029 Malignant neoplasm of tongue, unspecified: Secondary | ICD-10-CM | POA: Diagnosis not present

## 2022-05-22 MED ORDER — PILOCARPINE HCL 5 MG PO TABS
5.0000 mg | ORAL_TABLET | Freq: Three times a day (TID) | ORAL | 0 refills | Status: DC | PRN
  Filled 2022-05-22: qty 30, 10d supply, fill #0

## 2022-06-13 ENCOUNTER — Telehealth: Payer: Self-pay | Admitting: Family Medicine

## 2022-06-13 ENCOUNTER — Other Ambulatory Visit (HOSPITAL_COMMUNITY): Payer: Self-pay

## 2022-06-13 ENCOUNTER — Other Ambulatory Visit (HOSPITAL_BASED_OUTPATIENT_CLINIC_OR_DEPARTMENT_OTHER): Payer: Self-pay

## 2022-06-13 DIAGNOSIS — R41840 Attention and concentration deficit: Secondary | ICD-10-CM

## 2022-06-13 MED ORDER — LISDEXAMFETAMINE DIMESYLATE 20 MG PO CAPS
20.0000 mg | ORAL_CAPSULE | Freq: Every day | ORAL | 0 refills | Status: DC
  Filled 2022-06-13: qty 30, 30d supply, fill #0

## 2022-06-13 NOTE — Telephone Encounter (Signed)
PDMP okay, Rx sent 

## 2022-06-13 NOTE — Telephone Encounter (Signed)
Medication: Vyvanse  Has the patient contacted their pharmacy? No. (If no, request that the patient contact the pharmacy for the refill.) (If yes, when and what did the pharmacy advise?)  Preferred Pharmacy (with phone number or street name): [facility]  Agent: Please be advised that RX refills may take up to 3 business days. We ask that you follow-up with your pharmacy.    Dr Charlett Blake is asking it to be sent today please.

## 2022-07-10 ENCOUNTER — Telehealth (INDEPENDENT_AMBULATORY_CARE_PROVIDER_SITE_OTHER): Payer: Commercial Managed Care - PPO | Admitting: Family

## 2022-07-10 ENCOUNTER — Telehealth: Payer: Self-pay | Admitting: Family

## 2022-07-10 DIAGNOSIS — E039 Hypothyroidism, unspecified: Secondary | ICD-10-CM | POA: Diagnosis not present

## 2022-07-10 MED ORDER — LEVOTHYROXINE SODIUM 50 MCG PO TABS: 50.0000 ug | ORAL_TABLET | Freq: Every day | ORAL | 0 refills | Status: DC

## 2022-07-10 NOTE — Assessment & Plan Note (Signed)
New diagnosis for patient.  Likely secondary to her neck radiation.  Recommended that she begin synthroid 50 mcg once daily in the AM on an empty stomach.  She will get a free T4 drawn at her [facility] next week.  Plan follow up TSH in 6 weeks at her [facility] and she will send [place] the results.  Clinic is requesting an order from [place] for both of those tests. We will fax to them.

## 2022-07-10 NOTE — Telephone Encounter (Signed)
Please fax lab order to [facility] services:  Phone: [phone] Fax:     [phone]

## 2022-07-10 NOTE — Progress Notes (Signed)
Subjective:     Patient ID: Christina Goodwin, female    DOB: 09-Aug-2000, 22 y.o.   MRN: [id number]  Chief Complaint  Patient presents with   Thyroid Problem    Follow up on lab results     Thyroid Problem   Patient is in today to discuss newly diagnosed hypothyroidism. Pmhx is significant for Squamous cell carcinoma of the tongue for which she has completed chemotherapy and radiation.  More recently she reports increasing fatigue.  She had a TSH drawn at her school and came back at 8.5.  Fatigue began in February. She reports + weight gain of 10 pounds over 3 months.   Health Maintenance Due  Topic Date Due   HIV Screening  Never done   Hepatitis C Screening  Never done   PAP-Cervical Cytology Screening  Never done   PAP SMEAR-Modifier  Never done   COVID-19 Vaccine (5 - 2023-24 season) 12/13/2021    Past Medical History:  Diagnosis Date   Fracture of fourth toe, left, closed 12/17/2011   Fracture tibia/fibula 2010   Left (s/p a fall--not sports related)   H/O finger fracture    History of concussion approx age 89   Fell off couch due to laughing too hard; had n/v and HA x 24h---no return of sx's since that time.   Osteopetrosis    Right rib fracture    2 ribs fractured in 2 spots with small pneumothorax--f/u cxr good.    Past Surgical History:  Procedure Laterality Date   FRACTURE SURGERY  2010   tib/fib repair and pin removal (left); 2 surgeries    Family History  Problem Relation Age of Onset   Migraines Mother        with puberty   Headache Father    Migraines Sister    Migraines Maternal Grandmother        worse perimenopausal   Migraines Paternal 70    Migraines Sister        not as bad as the pt & sister [name]   Other Paternal Grandfather        osteopetrosis   Migraines Other     Social History   Socioeconomic History   Marital status: Single    Spouse name: Not on file   Number of children: Not on file   Years of education: Not  on file   Highest education level: Not on file  Occupational History   Not on file  Tobacco Use   Smoking status: Never   Smokeless tobacco: Never  Vaping Use   Vaping Use: Never used  Substance and Sexual Activity   Alcohol use: No   Drug use: No   Sexual activity: Not on file  Other Topics Concern   Not on file  Social History Narrative   She is about to be sophomore in college    Lives with parents   Right handed   Caffeine: none             Social Determinants of Health   Financial Resource Strain: Not on file  Food Insecurity: Not on file  Transportation Needs: Not on file  Physical Activity: Not on file  Stress: Not on file  Social Connections: Not on file  Intimate Partner Violence: Not on file    Outpatient Medications Prior to Visit  Medication Sig Dispense Refill   ALPRAZolam (XANAX) 0.5 MG tablet Take 1/2 - 1 tablet (0.25-0.5 mg total) by mouth 3 (three) times daily  as needed for anxiety. Start with 1/2 tablet (0.25 mg). 30 tablet 0   etonogestrel (NEXPLANON) 68 MG IMPL implant 1 each by Subdermal route once.     fluconazole (DIFLUCAN) 100 MG tablet Take 1 tablet by mouth once a day 11 tablet 0   HYDROcodone-acetaminophen (NORCO/VICODIN) 5-325 MG tablet TAKE 1 TABLET BY MOUTH EVERY 4 TO 6 HOURS AS NEEDED FOR PAIN. 5 tablet 0   HYDROmorphone (DILAUDID) 2 MG tablet Take 1 - 2 tablets by mouth every 4 hours as needed for moderate pain (4-6) or severe pain (7-10). 60 tablet 0   lisdexamfetamine (VYVANSE) 20 MG capsule Take 1 capsule (20 mg total) by mouth daily. 30 capsule 0   omeprazole (PRILOSEC) 20 MG capsule Take 1 capsule (20 mg total) by mouth daily. 30 capsule 3   ondansetron (ZOFRAN) 8 MG tablet Take 1 tablet (8 mg total) by mouth every 8 (eight) hours as needed for nausea or vomiting. 50 tablet 0   pilocarpine (SALAGEN) 5 MG tablet Take 1 tablet (5 mg total) by mouth 3 (three) times daily as needed. 30 tablet 0   prochlorperazine (COMPAZINE) 10 MG tablet  Take 1 tablet (10 mg total) by mouth every 6 (six) hours as needed for nausea (or vomiting that does not respond to ondansetron [Zofran]). 30 tablet 2   promethazine (PHENERGAN) 25 MG tablet Take 1 tablet (25 mg total) by mouth every 8 (eight) hours as needed for nausea or vomiting. 30 tablet 1   sodium fluoride (FLUORISHIELD) 1.1 % GEL dental gel Place 1 drop in each space of tray and insert after brushing/flossing. Wear for 10 mins at bedtime, remove and spit out excess. Do not eat, drink, or rinse for 30 mins. 56 g 5   SUMAtriptan (IMITREX) 50 MG tablet TAKE 1 TABLET BY MOUTH AT START OF MIGRAINE MAY REPEAT IN 2 HOURS IF HEADACHE PERSISTS OR RECURS *MAX 2 TABS IN 24 HOURS* 10 tablet 3   tretinoin (RETIN-A) 0.05 % cream Apply topically at bedtime. Use as needed 45 g 0   No facility-administered medications prior to visit.    Allergies  Allergen Reactions   Hydrocodone Hives    "Red Spots" 05/14/21 per mom pt has nausea and not true allergy    ROS    See HPI Objective:    Physical Exam Constitutional:      Appearance: Normal appearance.  Eyes:     Extraocular Movements: Extraocular movements intact.  Pulmonary:     Effort: Pulmonary effort is normal.  Neurological:     Mental Status: She is alert and oriented to person, place, and time.  Psychiatric:        Mood and Affect: Mood normal.        Behavior: Behavior normal.        Thought Content: Thought content normal.        Judgment: Judgment normal.     There were no vitals taken for this visit. Wt Readings from Last 3 Encounters:  11/07/21 146 lb 6.4 oz (66.4 kg)  04/16/21 167 lb (75.8 kg)  08/27/20 154 lb (69.9 kg) (83 %, Z= 0.97)*   * Growth percentiles are based on CDC (Girls, 2-20 Years) data.       Assessment & Plan:   Problem List Items Addressed This Visit       Unprioritized   Hypothyroidism - Primary    New diagnosis for patient.  Likely secondary to her neck radiation.  Recommended that she begin  synthroid 50 mcg once daily in the AM on an empty stomach.  She will get a free T4 drawn at her [facility] next week.  Plan follow up TSH in 6 weeks at her [facility] and she will send [place] the results.  Clinic is requesting an order from [place] for both of those tests. We will fax to them.       Relevant Medications   levothyroxine (SYNTHROID) 50 MCG tablet    I am having Christina Goodwin start on levothyroxine. I am also having her maintain her Nexplanon, HYDROcodone-acetaminophen, sodium fluoride, ondansetron, promethazine, prochlorperazine, fluconazole, HYDROmorphone, ALPRAZolam, tretinoin, SUMAtriptan, omeprazole, pilocarpine, and lisdexamfetamine.  Meds ordered this encounter  Medications   levothyroxine (SYNTHROID) 50 MCG tablet    Sig: Take 1 tablet (50 mcg total) by mouth daily.    Dispense:  90 tablet    Refill:  0    Order Specific Question:   Supervising Provider    Answer:   Penni Homans A [[id number]]

## 2022-07-14 NOTE — Telephone Encounter (Signed)
Order faxed and original given to patient's mother.

## 2022-07-28 ENCOUNTER — Other Ambulatory Visit: Payer: Self-pay | Admitting: Family Medicine

## 2022-07-28 DIAGNOSIS — M79671 Pain in right foot: Secondary | ICD-10-CM

## 2022-07-28 DIAGNOSIS — G8929 Other chronic pain: Secondary | ICD-10-CM

## 2022-07-29 DIAGNOSIS — C029 Malignant neoplasm of tongue, unspecified: Secondary | ICD-10-CM | POA: Diagnosis not present

## 2022-08-06 ENCOUNTER — Other Ambulatory Visit (HOSPITAL_BASED_OUTPATIENT_CLINIC_OR_DEPARTMENT_OTHER): Payer: Self-pay

## 2022-08-06 ENCOUNTER — Other Ambulatory Visit: Payer: Self-pay | Admitting: Internal Medicine

## 2022-08-06 ENCOUNTER — Other Ambulatory Visit: Payer: Self-pay | Admitting: Family

## 2022-08-06 DIAGNOSIS — R41840 Attention and concentration deficit: Secondary | ICD-10-CM

## 2022-08-06 MED ORDER — LISDEXAMFETAMINE DIMESYLATE 20 MG PO CAPS
20.0000 mg | ORAL_CAPSULE | Freq: Every day | ORAL | 0 refills | Status: DC
  Filled 2022-08-06: qty 30, 30d supply, fill #0

## 2022-08-07 ENCOUNTER — Other Ambulatory Visit (HOSPITAL_BASED_OUTPATIENT_CLINIC_OR_DEPARTMENT_OTHER): Payer: Self-pay

## 2022-08-07 MED ORDER — LEVOTHYROXINE SODIUM 50 MCG PO TABS
50.0000 ug | ORAL_TABLET | Freq: Every day | ORAL | 1 refills | Status: DC
  Filled 2022-08-07 – 2022-09-27 (×2): qty 90, 90d supply, fill #0

## 2022-08-08 ENCOUNTER — Other Ambulatory Visit: Payer: Self-pay | Admitting: Family Medicine

## 2022-08-08 ENCOUNTER — Other Ambulatory Visit (HOSPITAL_BASED_OUTPATIENT_CLINIC_OR_DEPARTMENT_OTHER): Payer: Self-pay

## 2022-08-08 DIAGNOSIS — R41840 Attention and concentration deficit: Secondary | ICD-10-CM

## 2022-08-08 MED ORDER — LISDEXAMFETAMINE DIMESYLATE 20 MG PO CAPS
20.0000 mg | ORAL_CAPSULE | Freq: Every day | ORAL | 0 refills | Status: DC
Start: 2022-08-08 — End: 2023-05-11

## 2022-08-12 ENCOUNTER — Other Ambulatory Visit (HOSPITAL_BASED_OUTPATIENT_CLINIC_OR_DEPARTMENT_OTHER): Payer: Self-pay

## 2022-08-14 ENCOUNTER — Other Ambulatory Visit (HOSPITAL_BASED_OUTPATIENT_CLINIC_OR_DEPARTMENT_OTHER): Payer: Self-pay

## 2022-09-01 ENCOUNTER — Other Ambulatory Visit: Payer: Self-pay | Admitting: Family Medicine

## 2022-09-01 DIAGNOSIS — E039 Hypothyroidism, unspecified: Secondary | ICD-10-CM

## 2022-09-04 ENCOUNTER — Other Ambulatory Visit (INDEPENDENT_AMBULATORY_CARE_PROVIDER_SITE_OTHER): Payer: Commercial Managed Care - PPO

## 2022-09-04 DIAGNOSIS — E039 Hypothyroidism, unspecified: Secondary | ICD-10-CM

## 2022-09-04 LAB — TSH: TSH: 4.46 u[IU]/mL (ref 0.35–5.50)

## 2022-09-05 ENCOUNTER — Encounter: Payer: Self-pay | Admitting: Family Medicine

## 2022-09-27 ENCOUNTER — Other Ambulatory Visit (HOSPITAL_COMMUNITY): Payer: Self-pay

## 2022-09-29 ENCOUNTER — Other Ambulatory Visit: Payer: Self-pay

## 2022-09-30 ENCOUNTER — Encounter: Payer: Self-pay | Admitting: Pharmacist

## 2022-09-30 ENCOUNTER — Other Ambulatory Visit: Payer: Self-pay

## 2022-10-01 ENCOUNTER — Other Ambulatory Visit (HOSPITAL_COMMUNITY): Payer: Self-pay

## 2022-10-02 ENCOUNTER — Other Ambulatory Visit (HOSPITAL_COMMUNITY): Payer: Self-pay

## 2022-10-03 ENCOUNTER — Other Ambulatory Visit: Payer: Self-pay

## 2022-10-06 ENCOUNTER — Other Ambulatory Visit: Payer: Self-pay | Admitting: Family

## 2022-10-06 ENCOUNTER — Encounter: Payer: Self-pay | Admitting: Family Medicine

## 2022-10-06 MED ORDER — PILOCARPINE HCL 5 MG PO TABS: 5.0000 mg | ORAL_TABLET | Freq: Three times a day (TID) | ORAL | 1 refills | Status: AC | PRN

## 2022-10-23 ENCOUNTER — Other Ambulatory Visit (HOSPITAL_COMMUNITY): Payer: Self-pay

## 2022-10-23 DIAGNOSIS — M6289 Other specified disorders of muscle: Secondary | ICD-10-CM | POA: Diagnosis not present

## 2022-10-23 DIAGNOSIS — Q782 Osteopetrosis: Secondary | ICD-10-CM | POA: Diagnosis not present

## 2022-10-23 DIAGNOSIS — Z85810 Personal history of malignant neoplasm of tongue: Secondary | ICD-10-CM | POA: Diagnosis not present

## 2022-10-23 DIAGNOSIS — I890 Lymphedema, not elsewhere classified: Secondary | ICD-10-CM | POA: Diagnosis not present

## 2022-10-23 DIAGNOSIS — C029 Malignant neoplasm of tongue, unspecified: Secondary | ICD-10-CM | POA: Diagnosis not present

## 2022-10-23 DIAGNOSIS — R682 Dry mouth, unspecified: Secondary | ICD-10-CM | POA: Diagnosis not present

## 2022-10-23 MED ORDER — TIZANIDINE HCL 2 MG PO TABS
ORAL_TABLET | ORAL | 2 refills | Status: DC
  Filled 2022-10-23: qty 30, 7d supply, fill #0

## 2022-11-10 ENCOUNTER — Ambulatory Visit: Payer: Commercial Managed Care - PPO | Admitting: Family Medicine

## 2022-11-10 ENCOUNTER — Ambulatory Visit (INDEPENDENT_AMBULATORY_CARE_PROVIDER_SITE_OTHER): Payer: Commercial Managed Care - PPO

## 2022-11-10 ENCOUNTER — Other Ambulatory Visit (HOSPITAL_BASED_OUTPATIENT_CLINIC_OR_DEPARTMENT_OTHER): Payer: Self-pay

## 2022-11-10 ENCOUNTER — Other Ambulatory Visit: Payer: Self-pay

## 2022-11-10 ENCOUNTER — Encounter: Payer: Self-pay | Admitting: Family Medicine

## 2022-11-10 VITALS — BP 112/78 | HR 66 | Ht 66.0 in | Wt 146.0 lb

## 2022-11-10 DIAGNOSIS — S92512A Displaced fracture of proximal phalanx of left lesser toe(s), initial encounter for closed fracture: Secondary | ICD-10-CM | POA: Diagnosis not present

## 2022-11-10 DIAGNOSIS — M79672 Pain in left foot: Secondary | ICD-10-CM

## 2022-11-10 DIAGNOSIS — M25474 Effusion, right foot: Secondary | ICD-10-CM

## 2022-11-10 MED ORDER — MELOXICAM 15 MG PO TABS
15.0000 mg | ORAL_TABLET | Freq: Every day | ORAL | 0 refills | Status: DC
  Filled 2022-11-10: qty 30, 30d supply, fill #0

## 2022-11-10 NOTE — Patient Instructions (Signed)
Turf toe Meloxicam 15 mg  Wear moms boot for the next 10  If not better in 10 days I need to know about it and probably need to see you

## 2022-11-10 NOTE — Assessment & Plan Note (Signed)
Likely swelling with mild exacerbation secondary to potentially dancing.  We discussed a rigid soled cerumen or cam walker, discussed avoiding impact for short-term, meloxicam prescribed.  Increase activity as tolerated.  If not nearly completely gone in 10 days would need to further evaluate.

## 2022-11-10 NOTE — Progress Notes (Signed)
[facility] [address] [place] [address] Phone: [phone] Subjective:    I'm seeing this patient by the request  of:  Copland, Gwenlyn Found, MD  CC: Right foot injury  [id number]  Christina Goodwin is a 22 y.o. female coming in with complaint of L foot pain. Patient states that she slept on it crazy Thursday night, she was also dancing on it Thursday.  Barefoot Friday she was able to walk. But, Saturday she was not able to walk. She endorses swelling and the pain kept getting worse. Tylenol and ibu for the pain doesn't help. Pain right on sesamoid and great toe on the left foot.states pain spreads to the side of her foot.  She does have crutches.      Past Medical History:  Diagnosis Date   Fracture of fourth toe, left, closed 12/17/2011   Fracture tibia/fibula 2010   Left (s/p a fall--not sports related)   H/O finger fracture    History of concussion approx age 36   Fell off couch due to laughing too hard; had n/v and HA x 24h---no return of sx's since that time.   Osteopetrosis    Right rib fracture    2 ribs fractured in 2 spots with small pneumothorax--f/u cxr good.   Past Surgical History:  Procedure Laterality Date   FRACTURE SURGERY  2010   tib/fib repair and pin removal (left); 2 surgeries   Social History   Socioeconomic History   Marital status: Single    Spouse name: Not on file   Number of children: Not on file   Years of education: Not on file   Highest education level: Not on file  Occupational History   Not on file  Tobacco Use   Smoking status: Never   Smokeless tobacco: Never  Vaping Use   Vaping status: Never Used  Substance and Sexual Activity   Alcohol use: No   Drug use: No   Sexual activity: Not on file  Other Topics Concern   Not on file  Social History Narrative   She is about to be sophomore in college    Lives with parents   Right handed   Caffeine: none             Social  Determinants of Health   Financial Resource Strain: Not on file  Food Insecurity: Not on file  Transportation Needs: Not on file  Physical Activity: Not on file  Stress: Not on file  Social Connections: Not on file   Allergies  Allergen Reactions   Hydrocodone Hives    "Red Spots" 05/14/21 per mom pt has nausea and not true allergy   Family History  Problem Relation Age of Onset   Migraines Mother        with puberty   Headache Father    Migraines Sister    Migraines Maternal Grandmother        worse perimenopausal   Migraines Paternal Grandmother    Migraines Sister        not as bad as the pt & sister [name]   Other Paternal Grandfather        osteopetrosis   Migraines Other     Current Outpatient Medications (Endocrine & Metabolic):    etonogestrel (NEXPLANON) 68 MG IMPL implant, 1 each by Subdermal route once.   levothyroxine (SYNTHROID) 50 MCG tablet, Take 1 tablet (50 mcg total) by mouth daily before breakfast.   Current Outpatient Medications (Respiratory):  promethazine (PHENERGAN) 25 MG tablet, Take 1 tablet (25 mg total) by mouth every 8 (eight) hours as needed for nausea or vomiting.  Current Outpatient Medications (Analgesics):    HYDROcodone-acetaminophen (NORCO/VICODIN) 5-325 MG tablet, TAKE 1 TABLET BY MOUTH EVERY 4 TO 6 HOURS AS NEEDED FOR PAIN.   HYDROmorphone (DILAUDID) 2 MG tablet, Take 1 - 2 tablets by mouth every 4 hours as needed for moderate pain (4-6) or severe pain (7-10).   meloxicam (MOBIC) 15 MG tablet, Take 1 tablet (15 mg total) by mouth daily.   SUMAtriptan (IMITREX) 50 MG tablet, TAKE 1 TABLET BY MOUTH AT START OF MIGRAINE MAY REPEAT IN 2 HOURS IF HEADACHE PERSISTS OR RECURS *MAX 2 TABS IN 24 HOURS*   Current Outpatient Medications (Other):    ALPRAZolam (XANAX) 0.5 MG tablet, Take 1/2 - 1 tablet (0.25-0.5 mg total) by mouth 3 (three) times daily as needed for anxiety. Start with 1/2 tablet (0.25 mg).   fluconazole (DIFLUCAN) 100 MG  tablet, Take 1 tablet by mouth once a day   lisdexamfetamine (VYVANSE) 20 MG capsule, Take 1 capsule (20 mg total) by mouth daily.   omeprazole (PRILOSEC) 20 MG capsule, Take 1 capsule (20 mg total) by mouth daily.   ondansetron (ZOFRAN) 8 MG tablet, Take 1 tablet (8 mg total) by mouth every 8 (eight) hours as needed for nausea or vomiting.   pilocarpine (SALAGEN) 5 MG tablet, Take 1 tablet (5 mg total) by mouth 3 (three) times daily as needed.   prochlorperazine (COMPAZINE) 10 MG tablet, Take 1 tablet (10 mg total) by mouth every 6 (six) hours as needed for nausea (or vomiting that does not respond to ondansetron [Zofran]).   sodium fluoride (FLUORISHIELD) 1.1 % GEL dental gel, Place 1 drop in each space of tray and insert after brushing/flossing. Wear for 10 mins at bedtime, remove and spit out excess. Do not eat, drink, or rinse for 30 mins.   tiZANidine (ZANAFLEX) 2 MG tablet, Take 1 tablet (2 mg) by mouth every 6 hours as needed for muscle spasms.   tretinoin (RETIN-A) 0.05 % cream, Apply topically at bedtime. Use as needed   Reviewed prior external information including notes and imaging from  primary care provider As well as notes that were available from care everywhere and other healthcare systems.  Past medical history, social, surgical and family history all reviewed in electronic medical record.  No pertanent information unless stated regarding to the chief complaint.   Review of Systems:  No headache, visual changes, nausea, vomiting, diarrhea, constipation, dizziness, abdominal pain, skin rash, fevers, chills, night sweats, weight loss, swollen lymph nodes, body aches, j chest pain, shortness of breath, mood changes. POSITIVE muscle aches, joint swelling  Objective  Blood pressure 112/78, pulse 66, height 5\' 6"  (1.676 m), weight 146 lb (66.2 kg), SpO2 98%.   General: No apparent distress alert and oriented x3 mood and affect normal, dressed appropriately.  HEENT: Pupils equal,  extraocular movements intact  Respiratory: Patient's speak in full sentences and does not appear short of breath  Cardiovascular: No lower extremity edema, non tender, no erythema  Patient is nonambulatory at this moment.  Too much pain when she pushes down on the foot.  Pain over the sesamoid bone of the first toe as well as any movement of the first toe noted.  No erythema noted of the skin.  Patient did have x-rays that were independently visualized by me showing osteopetrosis but no cortical irregularity noted.  Trace effusion noted  of the first MTP.  Limited muscular skeletal ultrasound was performed and interpreted by Antoine Primas, M  Limited ultrasound shows the patient does have significant hypoechoic changes of the first MTP.  No cortical irregularity noted.  Sesamoid bone in the area has no cortical irregularity but does have some hypoechoic changes noted as well. Impression: Sesamoiditis and first toe capsulitis.    Impression and Recommendations:     The above documentation has been reviewed and is accurate and complete Judi Saa, DO

## 2022-11-26 ENCOUNTER — Other Ambulatory Visit (HOSPITAL_BASED_OUTPATIENT_CLINIC_OR_DEPARTMENT_OTHER): Payer: Self-pay

## 2023-01-15 ENCOUNTER — Encounter: Payer: Self-pay | Admitting: Family Medicine

## 2023-01-29 DIAGNOSIS — C029 Malignant neoplasm of tongue, unspecified: Secondary | ICD-10-CM | POA: Diagnosis not present

## 2023-02-05 ENCOUNTER — Other Ambulatory Visit: Payer: Self-pay

## 2023-02-05 ENCOUNTER — Other Ambulatory Visit (HOSPITAL_COMMUNITY): Payer: Self-pay

## 2023-02-05 DIAGNOSIS — E039 Hypothyroidism, unspecified: Secondary | ICD-10-CM

## 2023-02-05 MED ORDER — LEVOTHYROXINE SODIUM 50 MCG PO TABS
50.0000 ug | ORAL_TABLET | Freq: Every day | ORAL | 0 refills | Status: DC
  Filled 2023-02-05: qty 90, 90d supply, fill #0

## 2023-02-06 ENCOUNTER — Other Ambulatory Visit (HOSPITAL_COMMUNITY): Payer: Self-pay

## 2023-03-23 NOTE — Progress Notes (Unsigned)
[facility] [address] [place], [place] [address] [phone]  Date:  03/26/2023   Name:  Christina Goodwin   DOB:  2000/06/30   MRN:  [id number]  PCP:  Christina Cables, MD    Chief Complaint: No chief complaint on file.   History of Present Illness:  Christina Goodwin is a 22 y.o. very pleasant female patient who presents with the following:  Christina Goodwin is seen today for follow-up Most recent visit with myself was for her physical in July of last year History of attention deficit, hypothyroidism.  Otherwise she was doing fine until diagnosed with head neck cancer last year She was diagnosed with squamous cell carcinoma of the tongue in 2023-she had a partial glossectomy February 2023 She was seen by Christina Goodwin at [facility] in [place] is doing well, she has completed radiation and chemo.  Most recent CT neck and chest in July of this year.  They plan to do another CT in a year-this coming July  Patient Active Problem List   Diagnosis Date Noted   Swelling of first metatarsophalangeal (MTP) joint of right foot 11/10/2022   Hypothyroidism 07/10/2022   Tongue lesion 04/16/2021   Finger laceration 11/14/2019   Migraine with aura and with status migrainosus, not intractable 09/08/2019   Attention deficit 08/12/2017   Osteopetrosis 12/31/2015   Well child check 10/08/2011    Past Medical History:  Diagnosis Date   Fracture of fourth toe, left, closed 12/17/2011   Fracture tibia/fibula 2010   Left (s/p a fall--not sports related)   H/O finger fracture    History of concussion approx age 49   Fell off couch due to laughing too hard; had n/v and HA x 24h---no return of sx's since that time.   Osteopetrosis    Right rib fracture    2 ribs fractured in 2 spots with small pneumothorax--f/u cxr good.    Past Surgical History:  Procedure Laterality Date   FRACTURE SURGERY  2010   tib/fib repair and  pin removal (left); 2 surgeries    Social History   Tobacco Use   Smoking status: Never   Smokeless tobacco: Never  Vaping Use   Vaping status: Never Used  Substance Use Topics   Alcohol use: No   Drug use: No    Family History  Problem Relation Age of Onset   Migraines Mother        with puberty   Headache Father    Migraines Sister    Migraines Maternal Grandmother        worse perimenopausal   Migraines Paternal Grandmother    Migraines Sister        not as bad as the pt & sister Christina Goodwin   Other Paternal Grandfather        osteopetrosis   Migraines Other     Allergies  Allergen Reactions   Hydrocodone Hives    "Red Spots" 05/14/21 per mom pt has nausea and not true allergy    Medication list has been reviewed and updated.  Current Outpatient Medications on File Prior to Visit  Medication Sig Dispense Refill   ALPRAZolam (XANAX) 0.5 MG tablet Take 1/2 - 1 tablet (0.25-0.5 mg total) by mouth 3 (three) times daily as needed for anxiety. Start with 1/2 tablet (0.25 mg). 30 tablet 0   etonogestrel (NEXPLANON) 68 MG IMPL implant 1 each by Subdermal route once.  fluconazole (DIFLUCAN) 100 MG tablet Take 1 tablet by mouth once a day 11 tablet 0   HYDROcodone-acetaminophen (NORCO/VICODIN) 5-325 MG tablet TAKE 1 TABLET BY MOUTH EVERY 4 TO 6 HOURS AS NEEDED FOR PAIN. 5 tablet 0   HYDROmorphone (DILAUDID) 2 MG tablet Take 1 - 2 tablets by mouth every 4 hours as needed for moderate pain (4-6) or severe pain (7-10). 60 tablet 0   levothyroxine (SYNTHROID) 50 MCG tablet Take 1 tablet (50 mcg total) by mouth daily before breakfast. 90 tablet 0   lisdexamfetamine (VYVANSE) 20 MG capsule Take 1 capsule (20 mg total) by mouth daily. 30 capsule 0   meloxicam (MOBIC) 15 MG tablet Take 1 tablet (15 mg total) by mouth daily. 30 tablet 0   omeprazole (PRILOSEC) 20 MG capsule Take 1 capsule (20 mg total) by mouth daily. 30 capsule 3   ondansetron (ZOFRAN) 8 MG tablet Take 1 tablet (8  mg total) by mouth every 8 (eight) hours as needed for nausea or vomiting. 50 tablet 0   pilocarpine (SALAGEN) 5 MG tablet Take 1 tablet (5 mg total) by mouth 3 (three) times daily as needed. 270 tablet 1   prochlorperazine (COMPAZINE) 10 MG tablet Take 1 tablet (10 mg total) by mouth every 6 (six) hours as needed for nausea (or vomiting that does not respond to ondansetron [Zofran]). 30 tablet 2   promethazine (PHENERGAN) 25 MG tablet Take 1 tablet (25 mg total) by mouth every 8 (eight) hours as needed for nausea or vomiting. 30 tablet 1   sodium fluoride (FLUORISHIELD) 1.1 % GEL dental gel Place 1 drop in each space of tray and insert after brushing/flossing. Wear for 10 mins at bedtime, remove and spit out excess. Do not eat, drink, or rinse for 30 mins. 56 g 5   SUMAtriptan (IMITREX) 50 MG tablet TAKE 1 TABLET BY MOUTH AT START OF MIGRAINE MAY REPEAT IN 2 HOURS IF HEADACHE PERSISTS OR RECURS *MAX 2 TABS IN 24 HOURS* 10 tablet 3   tiZANidine (ZANAFLEX) 2 MG tablet Take 1 tablet (2 mg) by mouth every 6 hours as needed for muscle spasms. 30 tablet 2   tretinoin (RETIN-A) 0.05 % cream Apply topically at bedtime. Use as needed 45 g 0   [DISCONTINUED] sucralfate (CARAFATE) 1 g tablet Dissolve 1 tablet in at least of water and take by mouth four times a day. 40 tablet 0   No current facility-administered medications on file prior to visit.    Review of Systems:  As per HPI- otherwise negative.   Physical Examination: There were no vitals filed for this visit. There were no vitals filed for this visit. There is no height or weight on file to calculate BMI. Ideal Body Weight:    GEN: no acute distress. HEENT: Atraumatic, Normocephalic.  Ears and Nose: No external deformity. CV: RRR, No M/G/R. No JVD. No thrill. No extra heart sounds. PULM: CTA B, no wheezes, crackles, rhonchi. No retractions. No resp. distress. No accessory muscle use. ABD: S, NT, ND, +BS. No rebound. No HSM. EXTR: No  c/c/e PSYCH: Normally interactive. Conversant.    Assessment and Plan: ***  Signed Abbe Amsterdam, MD

## 2023-03-26 ENCOUNTER — Ambulatory Visit: Payer: Commercial Managed Care - PPO | Admitting: Family Medicine

## 2023-05-10 ENCOUNTER — Encounter: Payer: Self-pay | Admitting: Family Medicine

## 2023-05-10 DIAGNOSIS — R41840 Attention and concentration deficit: Secondary | ICD-10-CM

## 2023-05-10 DIAGNOSIS — E039 Hypothyroidism, unspecified: Secondary | ICD-10-CM

## 2023-05-11 MED ORDER — LISDEXAMFETAMINE DIMESYLATE 20 MG PO CAPS: 20.0000 mg | ORAL_CAPSULE | Freq: Every day | ORAL | 0 refills | Status: DC

## 2023-05-11 MED ORDER — LEVOTHYROXINE SODIUM 50 MCG PO TABS: 50.0000 ug | ORAL_TABLET | Freq: Every day | ORAL | 3 refills | Status: DC

## 2023-05-21 ENCOUNTER — Other Ambulatory Visit (HOSPITAL_COMMUNITY): Payer: Self-pay

## 2023-05-21 DIAGNOSIS — Z923 Personal history of irradiation: Secondary | ICD-10-CM | POA: Diagnosis not present

## 2023-05-21 DIAGNOSIS — C029 Malignant neoplasm of tongue, unspecified: Secondary | ICD-10-CM | POA: Diagnosis not present

## 2023-05-21 DIAGNOSIS — M62838 Other muscle spasm: Secondary | ICD-10-CM | POA: Diagnosis not present

## 2023-05-21 DIAGNOSIS — Z85810 Personal history of malignant neoplasm of tongue: Secondary | ICD-10-CM | POA: Diagnosis not present

## 2023-05-21 MED ORDER — SODIUM FLUORIDE 1.1 % DT GEL
DENTAL | 5 refills | Status: DC
  Filled 2023-05-21: qty 56, 28d supply, fill #0
  Filled 2023-06-08: qty 56, 30d supply, fill #0

## 2023-05-21 MED ORDER — TIZANIDINE HCL 2 MG PO TABS
2.0000 mg | ORAL_TABLET | Freq: Four times a day (QID) | ORAL | 2 refills | Status: AC
  Filled 2023-05-21 – 2023-06-08 (×2): qty 30, 8d supply, fill #0
  Filled 2023-11-20: qty 30, 8d supply, fill #1

## 2023-06-01 ENCOUNTER — Other Ambulatory Visit (HOSPITAL_COMMUNITY): Payer: Self-pay

## 2023-06-08 ENCOUNTER — Other Ambulatory Visit (HOSPITAL_COMMUNITY): Payer: Self-pay

## 2023-07-08 ENCOUNTER — Other Ambulatory Visit: Payer: Self-pay | Admitting: Family Medicine

## 2023-07-08 ENCOUNTER — Encounter: Payer: Self-pay | Admitting: Family Medicine

## 2023-07-08 DIAGNOSIS — R41840 Attention and concentration deficit: Secondary | ICD-10-CM

## 2023-07-09 ENCOUNTER — Other Ambulatory Visit (HOSPITAL_BASED_OUTPATIENT_CLINIC_OR_DEPARTMENT_OTHER): Payer: Self-pay

## 2023-07-09 ENCOUNTER — Other Ambulatory Visit: Payer: Self-pay | Admitting: Family Medicine

## 2023-07-09 DIAGNOSIS — R41840 Attention and concentration deficit: Secondary | ICD-10-CM

## 2023-07-09 MED ORDER — LISDEXAMFETAMINE DIMESYLATE 20 MG PO CAPS
20.0000 mg | ORAL_CAPSULE | Freq: Every day | ORAL | 0 refills | Status: AC
  Filled 2023-07-09: qty 30, 30d supply, fill #0

## 2023-07-10 ENCOUNTER — Other Ambulatory Visit (HOSPITAL_COMMUNITY): Payer: Self-pay

## 2023-07-26 ENCOUNTER — Other Ambulatory Visit: Payer: Self-pay | Admitting: Family Medicine

## 2023-07-26 DIAGNOSIS — E039 Hypothyroidism, unspecified: Secondary | ICD-10-CM

## 2023-07-26 MED ORDER — LEVOTHYROXINE SODIUM 50 MCG PO TABS: 50.0000 ug | ORAL_TABLET | Freq: Every day | ORAL | 3 refills | Status: DC

## 2023-08-27 DIAGNOSIS — C029 Malignant neoplasm of tongue, unspecified: Secondary | ICD-10-CM | POA: Diagnosis not present

## 2023-08-28 ENCOUNTER — Other Ambulatory Visit: Payer: Self-pay | Admitting: Family Medicine

## 2023-08-28 DIAGNOSIS — E039 Hypothyroidism, unspecified: Secondary | ICD-10-CM

## 2023-08-28 MED ORDER — LEVOTHYROXINE SODIUM 75 MCG PO TABS: 75.0000 ug | ORAL_TABLET | Freq: Every day | ORAL | 3 refills | Status: DC

## 2023-08-28 NOTE — Progress Notes (Signed)
 Received a message from pt that her TSH was elevated when checked recently by her oncology team She is on 50 mcg of levothyroxine  Will increase to 75 mcg- recheck in 4-6 weeks  GM, Christina Goodwin, dob [birth year] needs her Levothyroxine  increased from 50 per day. she had her TSH checked at [facility] yesterday by Dr Lexine Redder and it was 10.5. please send to [facility] in [place] [address] [facility] [phone]. TY

## 2023-11-24 ENCOUNTER — Other Ambulatory Visit: Payer: Self-pay

## 2023-11-24 ENCOUNTER — Encounter: Payer: Self-pay | Admitting: Pharmacist

## 2023-11-27 ENCOUNTER — Other Ambulatory Visit: Payer: Self-pay

## 2023-12-06 DIAGNOSIS — D0007 Carcinoma in situ of tongue: Secondary | ICD-10-CM | POA: Insufficient documentation

## 2023-12-06 NOTE — Progress Notes (Unsigned)
 [facility] [address] [place], [place] [address] [phone]  Date:  12/09/2023   Name:  Christina Goodwin   DOB:  10/10/2000   MRN:  [id number]  PCP:  Watt Harlene BROCKS, MD    Chief Complaint: No chief complaint on file.   History of Present Illness:  Christina Goodwin is a 23 y.o. very pleasant female patient who presents with the following:  Patient is in today for a physical exam.  I last saw Christina Goodwin in person about 2 years ago History of hypothyroidism, ADD, tongue cancer She was diagnosed with squamous cell carcinoma of the tongue in 2023-she was treated by [facility].  Most recent visit with ENT on chart is from May of this year At that time they noted her treatment was complete but CT scan planned for  Recommend flu shot, COVID booster if available this fall Pap smear We recently adjusted her levothyroxine -check a TSH today  Patient Active Problem List   Diagnosis Date Noted   Swelling of first metatarsophalangeal (MTP) joint of right foot 11/10/2022   Hypothyroidism 07/10/2022   Tongue lesion 04/16/2021   Finger laceration 11/14/2019   Migraine with aura and with status migrainosus, not intractable 09/08/2019   Attention deficit 08/12/2017   Osteopetrosis 12/31/2015   Well child check 10/08/2011    Past Medical History:  Diagnosis Date   Fracture of fourth toe, left, closed 12/17/2011   Fracture tibia/fibula 2010   Left (s/p a fall--not sports related)   H/O finger fracture    History of concussion approx age 11   Fell off couch due to laughing too hard; had n/v and HA x 24h---no return of sx's since that time.   Osteopetrosis    Right rib fracture    2 ribs fractured in 2 spots with small pneumothorax--f/u cxr good.    Past Surgical History:  Procedure Laterality Date   FRACTURE SURGERY  2010   tib/fib repair and pin removal (left); 2 surgeries    Social History    Tobacco Use   Smoking status: Never   Smokeless tobacco: Never  Vaping Use   Vaping status: Never Used  Substance Use Topics   Alcohol use: No   Drug use: No    Family History  Problem Relation Age of Onset   Migraines Mother        with puberty   Headache Father    Migraines Sister    Migraines Maternal Grandmother        worse perimenopausal   Migraines Paternal Grandmother    Migraines Sister        not as bad as the pt & sister Laneta   Other Paternal Grandfather        osteopetrosis   Migraines Other     Allergies  Allergen Reactions   Hydrocodone  Hives    Red Spots 05/14/21 per mom pt has nausea and not true allergy    Medication list has been reviewed and updated.  Current Outpatient Medications on File Prior to Visit  Medication Sig Dispense Refill   ALPRAZolam  (XANAX ) 0.5 MG tablet Take 1/2 - 1 tablet (0.25-0.5 mg total) by mouth 3 (three) times daily as needed for anxiety. Start with 1/2 tablet (0.25 mg). 30 tablet 0   etonogestrel  (NEXPLANON ) 68 MG IMPL implant 1 each by Subdermal route once.     fluconazole  (DIFLUCAN ) 100 MG tablet Take 1 tablet by  mouth once a day 11 tablet 0   HYDROcodone -acetaminophen  (NORCO/VICODIN) 5-325 MG tablet TAKE 1 TABLET BY MOUTH EVERY 4 TO 6 HOURS AS NEEDED FOR PAIN. 5 tablet 0   HYDROmorphone  (DILAUDID ) 2 MG tablet Take 1 - 2 tablets by mouth every 4 hours as needed for moderate pain (4-6) or severe pain (7-10). 60 tablet 0   levothyroxine  (SYNTHROID ) 75 MCG tablet Take 1 tablet (75 mcg total) by mouth daily before breakfast. 30 tablet 3   lisdexamfetamine (VYVANSE ) 20 MG capsule Take 1 capsule (20 mg total) by mouth daily. 30 capsule 0   meloxicam  (MOBIC ) 15 MG tablet Take 1 tablet (15 mg total) by mouth daily. 30 tablet 0   omeprazole  (PRILOSEC) 20 MG capsule Take 1 capsule (20 mg total) by mouth daily. 30 capsule 3   ondansetron  (ZOFRAN ) 8 MG tablet Take 1 tablet (8 mg total) by mouth every 8 (eight) hours as needed for  nausea or vomiting. 50 tablet 0   pilocarpine  (SALAGEN ) 5 MG tablet Take 1 tablet (5 mg total) by mouth 3 (three) times daily as needed. 270 tablet 1   prochlorperazine  (COMPAZINE ) 10 MG tablet Take 1 tablet (10 mg total) by mouth every 6 (six) hours as needed for nausea (or vomiting that does not respond to ondansetron  [Zofran ]). 30 tablet 2   promethazine  (PHENERGAN ) 25 MG tablet Take 1 tablet (25 mg total) by mouth every 8 (eight) hours as needed for nausea or vomiting. 30 tablet 1   sodium fluoride  (FLUORISHIELD) 1.1 % GEL dental gel Place 1 drop in each space of tray and insert after brushing/flossing. Wear for 10 mins at bedtime, remove and spit out excess. Do not eat, drink, or rinse for 30 mins. 56 g 5   sodium fluoride  (FLUORISHIELD) 1.1 % GEL dental gel Use nightly as directed. 56 g 5   SUMAtriptan  (IMITREX ) 50 MG tablet TAKE 1 TABLET BY MOUTH AT START OF MIGRAINE MAY REPEAT IN 2 HOURS IF HEADACHE PERSISTS OR RECURS *MAX 2 TABS IN 24 HOURS* 10 tablet 3   tiZANidine  (ZANAFLEX ) 2 MG tablet Take 1 tablet (2 mg) by mouth every 6 hours as needed for muscle spasms. 30 tablet 2   tiZANidine  (ZANAFLEX ) 2 MG tablet Take 1 tablet (2 mg total) by mouth every 6 (six) hours as needed for muscle spasms. 30 tablet 2   tretinoin  (RETIN-A ) 0.05 % cream Apply topically at bedtime. Use as needed 45 g 0   [DISCONTINUED] sucralfate  (CARAFATE ) 1 g tablet Dissolve 1 tablet in at least 10mls of water and take by mouth four times a day. 40 tablet 0   No current facility-administered medications on file prior to visit.    Review of Systems:  As per HPI- otherwise negative.   Physical Examination: There were no vitals filed for this visit. There were no vitals filed for this visit. There is no height or weight on file to calculate BMI. Ideal Body Weight:    GEN: no acute distress. HEENT: Atraumatic, Normocephalic.  Ears and Nose: No external deformity. CV: RRR, No M/G/R. No JVD. No thrill. No extra heart  sounds. PULM: CTA B, no wheezes, crackles, rhonchi. No retractions. No resp. distress. No accessory muscle use. ABD: S, NT, ND, +BS. No rebound. No HSM. EXTR: No c/c/e PSYCH: Normally interactive. Conversant.    Assessment and Plan: ***  Signed Harlene Schroeder, MD

## 2023-12-09 ENCOUNTER — Encounter: Payer: Self-pay | Admitting: Family Medicine

## 2023-12-09 ENCOUNTER — Ambulatory Visit (INDEPENDENT_AMBULATORY_CARE_PROVIDER_SITE_OTHER): Admitting: Family Medicine

## 2023-12-09 ENCOUNTER — Other Ambulatory Visit (HOSPITAL_COMMUNITY)
Admission: RE | Admit: 2023-12-09 | Discharge: 2023-12-09 | Disposition: A | Discharge status: Home / Self Care | Source: Ambulatory Visit | Attending: Family Medicine | Admitting: Family Medicine

## 2023-12-09 VITALS — BP 124/88 | HR 68 | Temp 97.8°F | Ht 66.0 in | Wt 172.0 lb

## 2023-12-09 DIAGNOSIS — D0007 Carcinoma in situ of tongue: Secondary | ICD-10-CM

## 2023-12-09 DIAGNOSIS — Z13220 Encounter for screening for lipoid disorders: Secondary | ICD-10-CM | POA: Diagnosis not present

## 2023-12-09 DIAGNOSIS — Z130 Encounter for screening for diseases of the blood and blood-forming organs and certain disorders involving the immune mechanism: Secondary | ICD-10-CM

## 2023-12-09 DIAGNOSIS — Z0000 Encounter for general adult medical examination without abnormal findings: Secondary | ICD-10-CM | POA: Diagnosis not present

## 2023-12-09 DIAGNOSIS — E039 Hypothyroidism, unspecified: Secondary | ICD-10-CM

## 2023-12-09 DIAGNOSIS — M25532 Pain in left wrist: Secondary | ICD-10-CM

## 2023-12-09 DIAGNOSIS — R41840 Attention and concentration deficit: Secondary | ICD-10-CM | POA: Diagnosis not present

## 2023-12-09 DIAGNOSIS — Z131 Encounter for screening for diabetes mellitus: Secondary | ICD-10-CM

## 2023-12-09 DIAGNOSIS — Z124 Encounter for screening for malignant neoplasm of cervix: Secondary | ICD-10-CM

## 2023-12-09 DIAGNOSIS — Z113 Encounter for screening for infections with a predominantly sexual mode of transmission: Secondary | ICD-10-CM | POA: Diagnosis not present

## 2023-12-09 MED ORDER — TIZANIDINE HCL 2 MG PO TABS: ORAL_TABLET | ORAL | 2 refills | Status: DC

## 2023-12-09 NOTE — Patient Instructions (Signed)
 Good to see you today!  I will be in touch with your labs Recommend flu shot this fal

## 2023-12-10 ENCOUNTER — Encounter: Payer: Self-pay | Admitting: Family Medicine

## 2023-12-10 LAB — HIV ANTIBODY (ROUTINE TESTING W REFLEX): HIV 1&2 Ab, 4th Generation: NONREACTIVE

## 2023-12-10 LAB — LIPID PANEL
Cholesterol: 225 mg/dL — ABNORMAL HIGH (ref 0–200)
HDL: 59.9 mg/dL (ref >39.00)
LDL Cholesterol: 130 mg/dL — ABNORMAL HIGH (ref 0–99)
NonHDL: 165.02
Total CHOL/HDL Ratio: 4
Triglycerides: 175 mg/dL — ABNORMAL HIGH (ref 0.0–149.0)
VLDL: 35 mg/dL (ref 0.0–40.0)

## 2023-12-10 LAB — COMPREHENSIVE METABOLIC PANEL WITH GFR
ALT: 22 U/L (ref 0–35)
AST: 42 U/L — ABNORMAL HIGH (ref 0–37)
Albumin: 5.3 g/dL — ABNORMAL HIGH (ref 3.5–5.2)
Alkaline Phosphatase: 105 U/L (ref 39–117)
BUN: 13 mg/dL (ref 6–23)
CO2: 26 meq/L (ref 19–32)
Calcium: 10.2 mg/dL (ref 8.4–10.5)
Chloride: 101 meq/L (ref 96–112)
Creatinine, Ser: 0.65 mg/dL (ref 0.40–1.20)
GFR: 124.45 mL/min (ref >60.00)
Glucose, Bld: 78 mg/dL (ref 70–99)
Potassium: 4 meq/L (ref 3.5–5.1)
Sodium: 139 meq/L (ref 135–145)
Total Bilirubin: 0.4 mg/dL (ref 0.2–1.2)
Total Protein: 8.7 g/dL — ABNORMAL HIGH (ref 6.0–8.3)

## 2023-12-10 LAB — HEMOGLOBIN A1C: Hgb A1c MFr Bld: 5.6 % (ref 4.6–6.5)

## 2023-12-10 LAB — HEPATITIS B SURFACE ANTIGEN: Hepatitis B Surface Ag: NONREACTIVE

## 2023-12-10 LAB — CBC
HCT: 43.1 % (ref 36.0–46.0)
Hemoglobin: 14.2 g/dL (ref 12.0–15.0)
MCHC: 33.1 g/dL (ref 30.0–36.0)
MCV: 87.4 fl (ref 78.0–100.0)
Platelets: 317 K/uL (ref 150.0–400.0)
RBC: 4.92 Mil/uL (ref 3.87–5.11)
RDW: 13.5 % (ref 11.5–15.5)
WBC: 6.1 K/uL (ref 4.0–10.5)

## 2023-12-10 LAB — RPR: RPR Ser Ql: NONREACTIVE

## 2023-12-10 LAB — HEPATITIS B SURFACE ANTIBODY, QUANTITATIVE: Hep B S AB Quant (Post): <5 m[IU]/mL — ABNORMAL LOW (ref >=10)

## 2023-12-10 LAB — TSH: TSH: 2.82 u[IU]/mL (ref 0.35–5.50)

## 2023-12-10 LAB — HEPATITIS C ANTIBODY: Hepatitis C Ab: NONREACTIVE

## 2023-12-11 ENCOUNTER — Encounter: Payer: Self-pay | Admitting: Family Medicine

## 2023-12-11 LAB — CYTOLOGY - PAP
Chlamydia: NEGATIVE
Comment: NEGATIVE
Comment: NORMAL
Diagnosis: NEGATIVE
Neisseria Gonorrhea: NEGATIVE

## 2023-12-12 ENCOUNTER — Encounter: Payer: Self-pay | Admitting: Family Medicine

## 2023-12-17 DIAGNOSIS — Z9049 Acquired absence of other specified parts of digestive tract: Secondary | ICD-10-CM | POA: Diagnosis not present

## 2023-12-17 DIAGNOSIS — Z08 Encounter for follow-up examination after completed treatment for malignant neoplasm: Secondary | ICD-10-CM | POA: Diagnosis not present

## 2023-12-17 DIAGNOSIS — Z85810 Personal history of malignant neoplasm of tongue: Secondary | ICD-10-CM | POA: Diagnosis not present

## 2023-12-17 DIAGNOSIS — Z923 Personal history of irradiation: Secondary | ICD-10-CM | POA: Diagnosis not present

## 2023-12-17 DIAGNOSIS — C029 Malignant neoplasm of tongue, unspecified: Secondary | ICD-10-CM | POA: Diagnosis not present

## 2023-12-24 ENCOUNTER — Other Ambulatory Visit: Payer: Self-pay | Admitting: Family Medicine

## 2023-12-24 DIAGNOSIS — E039 Hypothyroidism, unspecified: Secondary | ICD-10-CM

## 2023-12-25 ENCOUNTER — Telehealth: Admitting: Emergency Medicine

## 2023-12-25 DIAGNOSIS — J029 Acute pharyngitis, unspecified: Secondary | ICD-10-CM

## 2023-12-25 MED ORDER — AMOXICILLIN 500 MG PO CAPS: 500.0000 mg | ORAL_CAPSULE | Freq: Three times a day (TID) | ORAL | 0 refills | Status: AC

## 2023-12-25 NOTE — Patient Instructions (Signed)
 Christina Goodwin, thank you for joining Lamar Schlossman, PA-C for today's virtual visit.  While this provider is not your primary care provider (PCP), if your PCP is located in our provider database this encounter information will be shared with them immediately following your visit.   A [facility] MyChart account gives you access to today's visit and all your visits, tests, and labs performed at [facility]  click here if you don't have a [facility] MyChart account or go to [email or web address]  Consent: (Patient) Christina Goodwin provided verbal consent for this virtual visit at the beginning of the encounter.  Current Medications:  Current Outpatient Medications:    amoxicillin  (AMOXIL ) 500 MG capsule, Take 1 capsule (500 mg total) by mouth 3 (three) times daily for 10 days., Disp: 30 capsule, Rfl: 0   ALPRAZolam  (XANAX ) 0.5 MG tablet, Take 1/2 - 1 tablet (0.25-0.5 mg total) by mouth 3 (three) times daily as needed for anxiety. Start with 1/2 tablet (0.25 mg). (Patient not taking: Reported on 12/09/2023), Disp: 30 tablet, Rfl: 0   etonogestrel  (NEXPLANON ) 68 MG IMPL implant, 1 each by Subdermal route once., Disp: , Rfl:    levothyroxine  (SYNTHROID ) 75 MCG tablet, Take 1 tablet (75 mcg total) by mouth daily before breakfast., Disp: 90 tablet, Rfl: 1   lisdexamfetamine (VYVANSE ) 20 MG capsule, Take 1 capsule (20 mg total) by mouth daily., Disp: 30 capsule, Rfl: 0   pilocarpine  (SALAGEN ) 5 MG tablet, Take 1 tablet (5 mg total) by mouth 3 (three) times daily as needed., Disp: 270 tablet, Rfl: 1   SUMAtriptan  (IMITREX ) 50 MG tablet, TAKE 1 TABLET BY MOUTH AT START OF MIGRAINE MAY REPEAT IN 2 HOURS IF HEADACHE PERSISTS OR RECURS *MAX 2 TABS IN 24 HOURS* (Patient not taking: Reported on 12/09/2023), Disp: 10 tablet, Rfl: 3   tiZANidine  (ZANAFLEX ) 2 MG tablet, Take 1 tablet (2 mg total) by mouth every 6 (six) hours as needed for muscle spasms., Disp: 30 tablet, Rfl: 2    tiZANidine  (ZANAFLEX ) 2 MG tablet, Take 1 tablet (2 mg) by mouth every 6 hours as needed for muscle spasms., Disp: 30 tablet, Rfl: 2   tretinoin  (RETIN-A ) 0.05 % cream, Apply topically at bedtime. Use as needed (Patient not taking: Reported on 12/09/2023), Disp: 45 g, Rfl: 0   Medications ordered in this encounter:  Meds ordered this encounter  Medications   amoxicillin  (AMOXIL ) 500 MG capsule    Sig: Take 1 capsule (500 mg total) by mouth 3 (three) times daily for 10 days.    Dispense:  30 capsule    Refill:  0    Supervising Provider:   BLAISE ALEENE KIDD [[id number]]     *If you need refills on other medications prior to your next appointment, please contact your pharmacy*  Follow-Up: Call back or seek an in-person evaluation if the symptoms worsen or if the condition fails to improve as anticipated.  [facility] [phone]  Other Instructions    If you have been instructed to have an in-person evaluation today at a local Urgent Care facility, please use the link below. It will take you to a list of all of our available [facility], including address, phone number and hours of operation. Please do not delay care.  [facility]  If you or a family member do not have a primary care provider, use the link below to schedule a visit and establish care. When you choose a [facility] primary care physician or advanced practice provider, you gain a long-term partner in health. Find a Primary Care Provider  Learn more about [facility]'s in-office and virtual care options: [facility] - Get Care Now

## 2023-12-25 NOTE — Progress Notes (Signed)
 Virtual Visit Consent   Christina Goodwin, you are scheduled for a virtual visit with a [facility] provider today. Just as with appointments in the office, your consent must be obtained to participate. Your consent will be active for this visit and any virtual visit you may have with one of our providers in the next 365 days. If you have a MyChart account, a copy of this consent can be sent to you electronically.  As this is a virtual visit, video technology does not allow for your provider to perform a traditional examination. This may limit your provider's ability to fully assess your condition. If your provider identifies any concerns that need to be evaluated in person or the need to arrange testing (such as labs, EKG, etc.), we will make arrangements to do so. Although advances in technology are sophisticated, we cannot ensure that it will always work on either your end or our end. If the connection with a video visit is poor, the visit may have to be switched to a telephone visit. With either a video or telephone visit, we are not always able to ensure that we have a secure connection.  By engaging in this virtual visit, you consent to the provision of healthcare and authorize for your insurance to be billed (if applicable) for the services provided during this visit. Depending on your insurance coverage, you may receive a charge related to this service.  I need to obtain your verbal consent now. Are you willing to proceed with your visit today? Christina Goodwin has provided verbal consent on 12/25/2023 for a virtual visit (video or telephone). Lamar Schlossman, PA-C  Date: 12/25/2023 10:16 AM   Virtual Visit via Video Note   I, Lamar Schlossman, connected with  Christina Goodwin  ([id number], 2000/06/11) on 12/25/23 at 10:15 AM EDT by a video-enabled telemedicine application and verified that I am speaking with the correct person using two identifiers.  Location: Patient: Virtual  Visit Location Patient: Home Provider: Virtual Visit Location Provider: Home Office   I discussed the limitations of evaluation and management by telemedicine and the availability of in person appointments. The patient expressed understanding and agreed to proceed.    History of Present Illness: Christina Goodwin is a 23 y.o. who identifies as a female who was assigned female at birth, and is being seen today for sore throat and raspy voice for the past week.  States that roommate was sick with the same.  States that she hasn't had post-nasal drip.  She states that she isn't sure if she has had a fever.  She has had some chills and body aches.  States that she has had a slight cough.  She states that she does have swollen lymph nodes.  She has tested negative for COVID.  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 Start with 1/2 tablet (0.25 mg). (Patient not taking: Reported on 12/09/2023), Disp: 30 tablet, Rfl: 0   etonogestrel  (NEXPLANON ) 68 MG IMPL implant, 1 each by Subdermal route once., Disp: , Rfl:    levothyroxine  (SYNTHROID ) 75 MCG tablet, Take 1 tablet (75 mcg total) by mouth daily before breakfast., Disp: 90 tablet, Rfl: 1   lisdexamfetamine (VYVANSE ) 20 MG capsule, Take 1 capsule (20 mg total) by mouth daily.,  Disp: 30 capsule, Rfl: 0   pilocarpine  (SALAGEN ) 5 MG tablet, Take 1 tablet (5 mg total) by mouth 3 (three) times daily as needed., Disp: 270 tablet, Rfl: 1   SUMAtriptan  (IMITREX ) 50 MG tablet, TAKE 1 TABLET BY MOUTH AT START OF MIGRAINE MAY REPEAT IN 2 HOURS IF HEADACHE PERSISTS OR RECURS *MAX 2 TABS IN 24 HOURS* (Patient not taking: Reported on 12/09/2023), Disp: 10 tablet, Rfl: 3   tiZANidine  (ZANAFLEX ) 2 MG tablet, Take 1 tablet (2 mg total) by mouth every 6 (six) hours as needed for muscle spasms., Disp: 30 tablet, Rfl: 2   tiZANidine  (ZANAFLEX ) 2 MG tablet, Take 1 tablet (2 mg) by mouth every 6 hours as needed for muscle spasms., Disp: 30 tablet, Rfl: 2   tretinoin  (RETIN-A ) 0.05 % cream, Apply topically at bedtime. Use as needed (Patient not taking: Reported on 12/09/2023), Disp: 45 g, Rfl: 0  Observations/Objective: Patient is well-developed, well-nourished in no acute distress.  Resting comfortably at home.  Head is normocephalic, atraumatic.  No labored breathing.  Speech is clear and coherent with logical content.  Patient is alert and oriented at baseline.    Assessment and Plan: There are no diagnoses linked to this encounter.  Meds ordered this encounter  Medications   amoxicillin  (AMOXIL ) 500 MG capsule    Sig: Take 1 capsule (500 mg total) by mouth 3 (three) times daily for 10 days.    Dispense:  30 capsule    Refill:  0    Supervising Provider:   BLAISE ALEENE KIDD [id number]   Trial amox for pharyngitis.  Roommate was diagnosed with sore throat and recovered after antibiotic treatment.  Due to this exposure and strep in the community, feel that antibiotic trial is indicated.  Follow Up Instructions: I discussed the assessment and treatment plan with the patient. The patient was provided an opportunity to ask questions and all were answered. The patient agreed with the plan and demonstrated an understanding of the instructions.  A copy of instructions were sent to the  patient via MyChart unless otherwise noted below.     The patient was advised to call back or seek an in-person evaluation if the symptoms worsen or if the condition fails to improve as anticipated.    Lamar Schlossman, PA-C

## 2023-12-29 ENCOUNTER — Encounter: Payer: Self-pay | Admitting: Family Medicine

## 2024-01-07 DIAGNOSIS — S63032A Subluxation of midcarpal joint of left wrist, initial encounter: Secondary | ICD-10-CM | POA: Diagnosis not present

## 2024-01-07 DIAGNOSIS — Q782 Osteopetrosis: Secondary | ICD-10-CM | POA: Diagnosis not present

## 2024-01-26 DIAGNOSIS — S93402A Sprain of unspecified ligament of left ankle, initial encounter: Secondary | ICD-10-CM | POA: Diagnosis not present

## 2024-01-26 DIAGNOSIS — M25572 Pain in left ankle and joints of left foot: Secondary | ICD-10-CM | POA: Diagnosis not present

## 2024-01-26 DIAGNOSIS — M7989 Other specified soft tissue disorders: Secondary | ICD-10-CM | POA: Diagnosis not present

## 2024-01-27 ENCOUNTER — Encounter: Payer: Self-pay | Admitting: Family Medicine

## 2024-01-27 DIAGNOSIS — M25572 Pain in left ankle and joints of left foot: Secondary | ICD-10-CM | POA: Diagnosis not present

## 2024-01-27 DIAGNOSIS — M7989 Other specified soft tissue disorders: Secondary | ICD-10-CM | POA: Diagnosis not present

## 2024-01-28 DIAGNOSIS — Y9301 Activity, walking, marching and hiking: Secondary | ICD-10-CM | POA: Diagnosis not present

## 2024-01-28 DIAGNOSIS — S93402A Sprain of unspecified ligament of left ankle, initial encounter: Secondary | ICD-10-CM | POA: Diagnosis not present

## 2024-01-28 DIAGNOSIS — X500XXA Overexertion from strenuous movement or load, initial encounter: Secondary | ICD-10-CM | POA: Diagnosis not present

## 2024-02-01 DIAGNOSIS — S82402K Unspecified fracture of shaft of left fibula, subsequent encounter for closed fracture with nonunion: Secondary | ICD-10-CM | POA: Diagnosis not present

## 2024-02-01 DIAGNOSIS — S82492K Other fracture of shaft of left fibula, subsequent encounter for closed fracture with nonunion: Secondary | ICD-10-CM | POA: Diagnosis not present

## 2024-02-01 DIAGNOSIS — M85862 Other specified disorders of bone density and structure, left lower leg: Secondary | ICD-10-CM | POA: Diagnosis not present

## 2024-02-01 DIAGNOSIS — S82292K Other fracture of shaft of left tibia, subsequent encounter for closed fracture with nonunion: Secondary | ICD-10-CM | POA: Diagnosis not present

## 2024-02-11 DIAGNOSIS — S93402D Sprain of unspecified ligament of left ankle, subsequent encounter: Secondary | ICD-10-CM | POA: Diagnosis not present

## 2024-03-08 DIAGNOSIS — C029 Malignant neoplasm of tongue, unspecified: Secondary | ICD-10-CM | POA: Diagnosis not present

## 2024-04-04 ENCOUNTER — Other Ambulatory Visit (HOSPITAL_BASED_OUTPATIENT_CLINIC_OR_DEPARTMENT_OTHER): Payer: Self-pay

## 2024-04-04 ENCOUNTER — Ambulatory Visit: Admitting: Family Medicine

## 2024-04-04 ENCOUNTER — Encounter: Payer: Self-pay | Admitting: Family Medicine

## 2024-04-04 VITALS — BP 120/82 | HR 78 | Ht 66.0 in | Wt 167.6 lb

## 2024-04-04 DIAGNOSIS — G43101 Migraine with aura, not intractable, with status migrainosus: Secondary | ICD-10-CM | POA: Diagnosis not present

## 2024-04-04 DIAGNOSIS — E039 Hypothyroidism, unspecified: Secondary | ICD-10-CM

## 2024-04-04 DIAGNOSIS — M26621 Arthralgia of right temporomandibular joint: Secondary | ICD-10-CM | POA: Diagnosis not present

## 2024-04-04 MED ORDER — TIZANIDINE HCL 2 MG PO CAPS
2.0000 mg | ORAL_CAPSULE | Freq: Three times a day (TID) | ORAL | 0 refills | Status: AC | PRN
  Filled 2024-04-04: qty 60, 20d supply, fill #0

## 2024-04-04 MED ORDER — SUMATRIPTAN SUCCINATE 50 MG PO TABS
ORAL_TABLET | ORAL | 3 refills | Status: AC
  Filled 2024-04-04: qty 10, 17d supply, fill #0
  Filled 2024-04-04: qty 10, 30d supply, fill #0

## 2024-04-04 MED ORDER — MELOXICAM 15 MG PO TABS
15.0000 mg | ORAL_TABLET | Freq: Every day | ORAL | 0 refills | Status: AC
  Filled 2024-04-04: qty 30, 30d supply, fill #0

## 2024-04-04 MED ORDER — LEVOTHYROXINE SODIUM 75 MCG PO TABS
75.0000 ug | ORAL_TABLET | Freq: Every day | ORAL | 3 refills | Status: AC
  Filled 2024-04-04: qty 90, 90d supply, fill #0

## 2024-04-04 NOTE — Progress Notes (Signed)
 [facility] [address], [address] [place], [place] [address] [phone]  Date:  04/04/2024   Name:  Christina Goodwin   DOB:  Feb 01, 2001   MRN:  [id number]  PCP:  Watt Harlene BROCKS, MD    Chief Complaint: Ear Pain   History of Present Illness:  Christina Goodwin is a 23 y.o. very pleasant female patient who presents with the following:  Pt seen today with concern of ear and jaw pain I last saw her in August for her CPE History of hypothyroidism, ADD, tongue cancer She was diagnosed with squamous cell carcinoma of the tongue in 2023-she was treated by [facility].  Seen by her ENT team about 4 weeks ago at [facility]- doing ok Post surveillance scans 11/20/21 - CT neck - NED 11/20/21 - PET - NED 02/25/22 - CT neck - NED 10/23/22 CT neck and chest - NED 12/17/23 CT neck and chest - NED   Discussed the use of AI scribe software for clinical note transcription with the patient, who gave verbal consent to proceed.  History of Present Illness Christina COCKE is a 23 year old female who presents with right jaw pain.  She began experiencing intense right jaw pain about a week and a half ago. The pain worsens throughout the day, starting as barely noticeable in the morning and intensifying by night. 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 There is tenderness when clenching her teeth. No hearing loss, sore throat, fever, or chills.  Approximately four weeks ago, she experienced right ear pain around Thanksgiving. Her mother suspected an infection and gave her some bactrim that they had on hand, which helped. She thinks her current issue is actually different and not in her ear, but in the TMJ.  No hearing issues noted No fever or chills  She has a history of radiation treatment primarily on the left side, with a small dose on the  right. She recently had a dental check-up. She has been using Advil  for pain relief, which provides minimal help.  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   Osteopetrosis    Right rib fracture    2 ribs fractured in 2 spots with small pneumothorax--f/u cxr good.    Past Surgical History:  Procedure Laterality Date   FRACTURE SURGERY  2010   tib/fib repair and pin removal (left); 2 surgeries    Social  History[1]  Family History  Problem Relation Age of Onset   Migraines Mother        with puberty   Headache Father    Migraines Sister    Migraines Maternal Grandmother        worse perimenopausal   Migraines Paternal Grandmother    Migraines Sister        not as bad as the pt & sister Laneta   Other Paternal Grandfather        osteopetrosis   Migraines Other     Allergies[2]  Medication list has been reviewed and updated.  Medications Ordered Prior to Encounter[3]  Review of Systems:  As per HPI- otherwise negative.   Physical Examination: Vitals:   04/04/24 1152  BP: 120/82  Pulse: 78  SpO2: 99%   Vitals:   04/04/24 1152  Weight: 167  lb 9.6 oz (76 kg)  Height: 5' 6 (1.676 m)   Body mass index is 27.05 kg/m. Ideal Body Weight: Weight in (lb) to have BMI = 25: 154.6  GEN: no acute distress. Looks well  HEENT: Atraumatic, Normocephalic. Bilateral TM wnl, oropharynx normal.  PEERL,EOMI.  She is tender with palpation and ROM of the right TMJ Teeth are in good repair and non- tender Ears and Nose: No external deformity. CV: RRR, No M/G/R. No JVD. No thrill. No extra heart sounds. PULM: CTA B, no wheezes, crackles, rhonchi. No retractions. No resp. distress. No accessory muscle use. EXTR: No c/c/e PSYCH: Normally interactive. Conversant.    Assessment and Plan: TMJ tenderness, right - Plan: meloxicam  (MOBIC ) 15 MG tablet, tizanidine  (ZANAFLEX ) 2 MG capsule  Hypothyroidism, unspecified type - Plan: levothyroxine  (SYNTHROID ) 75 MCG tablet  Migraine with aura and with status migrainosus, not intractable - Plan: SUMAtriptan  (IMITREX ) 50 MG tablet  Assessment & Plan Temporomandibular joint disorder Right-sided jaw pain likely due to temporomandibular joint disorder, exacerbated by chewing and singing. Differential includes jaw joint issues rather than dental or ear infection. - Prescribed meloxicam  once daily for a few days to assess effectiveness. - Consider prednisone if no improvement. - Evaluate need for other dental care/ review of tooth alignment if symptoms persist.  Signed Harlene Schroeder, MD     [1]  Social History Tobacco Use   Smoking status: Never   Smokeless tobacco: Never  Vaping Use   Vaping status: Never Used  Substance Use Topics   Alcohol use: No   Drug use: No  [2]  Allergies Allergen Reactions   Hydrocodone  Hives    Red Spots 05/14/21 per mom pt has nausea and not true allergy  [3]  Current Outpatient Medications on File Prior to Visit  Medication Sig Dispense Refill   ALPRAZolam  (XANAX ) 0.5 MG tablet Take 1/2 - 1 tablet (0.25-0.5 mg total) by mouth 3 (three) times daily as needed  for anxiety. Start with 1/2 tablet (0.25 mg). 30 tablet 0   etonogestrel  (NEXPLANON ) 68 MG IMPL implant 1 each by Subdermal route once.     lisdexamfetamine  (VYVANSE ) 20 MG capsule Take 1 capsule (20 mg total) by mouth daily. 30 capsule 0   pilocarpine  (SALAGEN ) 5 MG tablet Take 1 tablet (5 mg total) by mouth 3 (three) times daily as needed. 270 tablet 1   tiZANidine  (ZANAFLEX ) 2 MG tablet Take 1 tablet (2 mg total) by mouth every 6 (six) hours as needed for muscle spasms. 30 tablet 2   tretinoin  (RETIN-A ) 0.05 % cream Apply topically at bedtime. Use as needed (Patient not taking: Reported on  12/09/2023) 45 g 0   [DISCONTINUED] sucralfate  (CARAFATE ) 1 g tablet Dissolve 1 tablet in at least 10mls of water and take by mouth four times a day. 40 tablet 0   No current facility-administered medications on file prior to visit.   "

## 2024-05-06 ENCOUNTER — Other Ambulatory Visit: Payer: Self-pay | Admitting: Family Medicine

## 2024-05-06 DIAGNOSIS — R41840 Attention and concentration deficit: Secondary | ICD-10-CM
